# Patient Record
Sex: Female | Born: 1955 | Race: White | Hispanic: No | Smoking: Former smoker
Health system: Southern US, Community
[De-identification: ages and names within clinical notes are randomized; demographics above are authoritative.]

## PROBLEM LIST (undated history)

## (undated) DIAGNOSIS — R609 Edema, unspecified: Secondary | ICD-10-CM

## (undated) DIAGNOSIS — F329 Major depressive disorder, single episode, unspecified: Secondary | ICD-10-CM

## (undated) DIAGNOSIS — F32A Depression, unspecified: Secondary | ICD-10-CM

## (undated) DIAGNOSIS — K219 Gastro-esophageal reflux disease without esophagitis: Secondary | ICD-10-CM

## (undated) DIAGNOSIS — G4730 Sleep apnea, unspecified: Secondary | ICD-10-CM

## (undated) DIAGNOSIS — D649 Anemia, unspecified: Secondary | ICD-10-CM

## (undated) DIAGNOSIS — M1990 Unspecified osteoarthritis, unspecified site: Secondary | ICD-10-CM

## (undated) DIAGNOSIS — F419 Anxiety disorder, unspecified: Secondary | ICD-10-CM

## (undated) DIAGNOSIS — I10 Essential (primary) hypertension: Secondary | ICD-10-CM

## (undated) HISTORY — PX: FRACTURE SURGERY: SHX138

## (undated) HISTORY — PX: TUBAL LIGATION: SHX77

## (undated) HISTORY — PX: HERNIA REPAIR: SHX51

## (undated) HISTORY — PX: ABDOMINAL HYSTERECTOMY: SHX81

## (undated) HISTORY — PX: ROTATOR CUFF REPAIR: SHX139

## (undated) HISTORY — PX: TONSILLECTOMY: SUR1361

---

## 2006-02-12 ENCOUNTER — Emergency Department: Payer: Self-pay | Admitting: Unknown Physician Specialty

## 2006-04-03 ENCOUNTER — Ambulatory Visit: Payer: Self-pay

## 2006-04-16 ENCOUNTER — Ambulatory Visit: Payer: Self-pay | Admitting: Obstetrics and Gynecology

## 2006-04-25 ENCOUNTER — Ambulatory Visit: Payer: Self-pay | Admitting: Orthopaedic Surgery

## 2006-12-18 ENCOUNTER — Ambulatory Visit: Payer: Self-pay | Admitting: Obstetrics and Gynecology

## 2007-01-07 ENCOUNTER — Inpatient Hospital Stay: Payer: Self-pay | Admitting: Obstetrics and Gynecology

## 2007-01-17 ENCOUNTER — Ambulatory Visit: Payer: Self-pay | Admitting: Obstetrics and Gynecology

## 2007-02-02 ENCOUNTER — Inpatient Hospital Stay: Payer: Self-pay | Admitting: Obstetrics and Gynecology

## 2007-08-06 ENCOUNTER — Ambulatory Visit: Payer: Self-pay

## 2007-09-03 ENCOUNTER — Ambulatory Visit: Payer: Self-pay | Admitting: Orthopaedic Surgery

## 2008-02-24 ENCOUNTER — Ambulatory Visit: Payer: Self-pay | Admitting: Unknown Physician Specialty

## 2010-09-22 ENCOUNTER — Ambulatory Visit: Payer: Self-pay | Admitting: Unknown Physician Specialty

## 2010-09-30 ENCOUNTER — Ambulatory Visit: Payer: Self-pay | Admitting: Unknown Physician Specialty

## 2010-10-03 ENCOUNTER — Ambulatory Visit: Payer: Self-pay | Admitting: Unknown Physician Specialty

## 2010-10-18 ENCOUNTER — Ambulatory Visit: Payer: Self-pay | Admitting: Surgery

## 2010-10-21 ENCOUNTER — Ambulatory Visit: Payer: Self-pay | Admitting: Surgery

## 2010-11-17 ENCOUNTER — Ambulatory Visit: Payer: Self-pay | Admitting: Surgery

## 2010-12-01 ENCOUNTER — Ambulatory Visit: Payer: Self-pay | Admitting: Surgery

## 2012-12-12 ENCOUNTER — Encounter (HOSPITAL_COMMUNITY)
Admission: EM | Disposition: A | Discharge status: Home / Self Care | Payer: Self-pay | Source: Home / Self Care | Attending: Emergency Medicine

## 2012-12-12 ENCOUNTER — Emergency Department (HOSPITAL_COMMUNITY)
Admission: EM | Admit: 2012-12-12 | Discharge: 2012-12-12 | Disposition: A | Discharge status: Home / Self Care | Payer: 59 | Attending: Emergency Medicine | Admitting: Emergency Medicine

## 2012-12-12 ENCOUNTER — Emergency Department (HOSPITAL_COMMUNITY): Payer: 59

## 2012-12-12 ENCOUNTER — Encounter (HOSPITAL_COMMUNITY): Payer: Self-pay | Admitting: Emergency Medicine

## 2012-12-12 ENCOUNTER — Emergency Department (HOSPITAL_COMMUNITY): Payer: 59 | Admitting: Anesthesiology

## 2012-12-12 ENCOUNTER — Encounter (HOSPITAL_COMMUNITY): Payer: Self-pay | Admitting: Anesthesiology

## 2012-12-12 DIAGNOSIS — E669 Obesity, unspecified: Secondary | ICD-10-CM | POA: Insufficient documentation

## 2012-12-12 DIAGNOSIS — S62101A Fracture of unspecified carpal bone, right wrist, initial encounter for closed fracture: Secondary | ICD-10-CM

## 2012-12-12 DIAGNOSIS — S2239XA Fracture of one rib, unspecified side, initial encounter for closed fracture: Secondary | ICD-10-CM | POA: Insufficient documentation

## 2012-12-12 DIAGNOSIS — K219 Gastro-esophageal reflux disease without esophagitis: Secondary | ICD-10-CM | POA: Insufficient documentation

## 2012-12-12 DIAGNOSIS — S52509A Unspecified fracture of the lower end of unspecified radius, initial encounter for closed fracture: Secondary | ICD-10-CM | POA: Insufficient documentation

## 2012-12-12 DIAGNOSIS — Z6829 Body mass index (BMI) 29.0-29.9, adult: Secondary | ICD-10-CM | POA: Insufficient documentation

## 2012-12-12 DIAGNOSIS — W11XXXA Fall on and from ladder, initial encounter: Secondary | ICD-10-CM | POA: Insufficient documentation

## 2012-12-12 DIAGNOSIS — S2232XA Fracture of one rib, left side, initial encounter for closed fracture: Secondary | ICD-10-CM

## 2012-12-12 DIAGNOSIS — F411 Generalized anxiety disorder: Secondary | ICD-10-CM | POA: Insufficient documentation

## 2012-12-12 DIAGNOSIS — Z87891 Personal history of nicotine dependence: Secondary | ICD-10-CM | POA: Insufficient documentation

## 2012-12-12 DIAGNOSIS — F329 Major depressive disorder, single episode, unspecified: Secondary | ICD-10-CM | POA: Insufficient documentation

## 2012-12-12 DIAGNOSIS — F3289 Other specified depressive episodes: Secondary | ICD-10-CM | POA: Insufficient documentation

## 2012-12-12 HISTORY — PX: ORIF WRIST FRACTURE: SHX2133

## 2012-12-12 HISTORY — DX: Major depressive disorder, single episode, unspecified: F32.9

## 2012-12-12 HISTORY — DX: Anemia, unspecified: D64.9

## 2012-12-12 HISTORY — DX: Depression, unspecified: F32.A

## 2012-12-12 LAB — BASIC METABOLIC PANEL
BUN: 15 mg/dL (ref 6–23)
CO2: 24 mEq/L (ref 19–32)
Calcium: 8.7 mg/dL (ref 8.4–10.5)
Creatinine, Ser: 0.85 mg/dL (ref 0.50–1.10)
Glucose, Bld: 100 mg/dL — ABNORMAL HIGH (ref 70–99)
Sodium: 137 mEq/L (ref 135–145)

## 2012-12-12 LAB — CBC WITH DIFFERENTIAL/PLATELET
Eosinophils Relative: 0 % (ref 0–5)
HCT: 33.9 % — ABNORMAL LOW (ref 36.0–46.0)
Lymphocytes Relative: 8 % — ABNORMAL LOW (ref 12–46)
Lymphs Abs: 1.1 10*3/uL (ref 0.7–4.0)
MCV: 79.2 fL (ref 78.0–100.0)
Monocytes Absolute: 0.6 10*3/uL (ref 0.1–1.0)
Monocytes Relative: 4 % (ref 3–12)
RBC: 4.28 MIL/uL (ref 3.87–5.11)
WBC: 13.3 10*3/uL — ABNORMAL HIGH (ref 4.0–10.5)

## 2012-12-12 SURGERY — OPEN REDUCTION INTERNAL FIXATION (ORIF) WRIST FRACTURE
Anesthesia: General | Site: Arm Lower | Laterality: Right | Wound class: Clean

## 2012-12-12 MED ORDER — MEPERIDINE HCL 25 MG/ML IJ SOLN: 6.2500 mg | INTRAMUSCULAR | Status: DC | PRN

## 2012-12-12 MED ORDER — PROPOFOL 10 MG/ML IV BOLUS
INTRAVENOUS | Status: DC | PRN
  Administered 2012-12-12: 150 mg via INTRAVENOUS

## 2012-12-12 MED ORDER — MORPHINE SULFATE 4 MG/ML IJ SOLN
4.0000 mg | Freq: Once | INTRAMUSCULAR | Status: AC
  Administered 2012-12-12: 4 mg via INTRAVENOUS
  Filled 2012-12-12: qty 1

## 2012-12-12 MED ORDER — MIDAZOLAM HCL 5 MG/5ML IJ SOLN
INTRAMUSCULAR | Status: DC | PRN
  Administered 2012-12-12 (×2): 1 mg via INTRAVENOUS

## 2012-12-12 MED ORDER — IOHEXOL 300 MG/ML  SOLN
100.0000 mL | Freq: Once | INTRAMUSCULAR | Status: AC | PRN
  Administered 2012-12-12: 100 mL via INTRAVENOUS

## 2012-12-12 MED ORDER — SODIUM CHLORIDE 0.9 % IV SOLN
INTRAVENOUS | Status: DC | PRN
  Administered 2012-12-12: 19:00:00 via INTRAVENOUS

## 2012-12-12 MED ORDER — CEFAZOLIN SODIUM-DEXTROSE 2-3 GM-% IV SOLR
INTRAVENOUS | Status: DC | PRN
  Administered 2012-12-12: 2 g via INTRAVENOUS

## 2012-12-12 MED ORDER — FENTANYL CITRATE 0.05 MG/ML IJ SOLN
50.0000 ug | Freq: Once | INTRAMUSCULAR | Status: AC
  Administered 2012-12-12: 50 ug via INTRAVENOUS
  Filled 2012-12-12: qty 2

## 2012-12-12 MED ORDER — OXYCODONE HCL 5 MG PO TABS
ORAL_TABLET | ORAL | Status: AC
  Administered 2012-12-12: 5 mg via ORAL
  Filled 2012-12-12: qty 1

## 2012-12-12 MED ORDER — ONDANSETRON HCL 4 MG/2ML IJ SOLN
INTRAMUSCULAR | Status: DC | PRN
  Administered 2012-12-12: 4 mg via INTRAVENOUS

## 2012-12-12 MED ORDER — HYDROMORPHONE HCL PF 1 MG/ML IJ SOLN
0.2500 mg | INTRAMUSCULAR | Status: DC | PRN
  Administered 2012-12-12: 0.25 mg via INTRAVENOUS
  Administered 2012-12-12: 0.5 mg via INTRAVENOUS

## 2012-12-12 MED ORDER — KETOROLAC TROMETHAMINE 30 MG/ML IJ SOLN
INTRAMUSCULAR | Status: AC
  Administered 2012-12-12: 30 mg via INTRAVENOUS
  Filled 2012-12-12: qty 1

## 2012-12-12 MED ORDER — HYDROMORPHONE HCL PF 1 MG/ML IJ SOLN
INTRAMUSCULAR | Status: AC
  Administered 2012-12-12: 0.25 mg via INTRAVENOUS
  Filled 2012-12-12: qty 1

## 2012-12-12 MED ORDER — ACETAMINOPHEN 10 MG/ML IV SOLN
INTRAVENOUS | Status: DC | PRN
  Administered 2012-12-12: 1000 mg via INTRAVENOUS

## 2012-12-12 MED ORDER — 0.9 % SODIUM CHLORIDE (POUR BTL) OPTIME
TOPICAL | Status: DC | PRN
  Administered 2012-12-12: 1000 mL

## 2012-12-12 MED ORDER — SUFENTANIL CITRATE 50 MCG/ML IV SOLN
INTRAVENOUS | Status: DC | PRN
  Administered 2012-12-12 (×4): 5 ug via INTRAVENOUS

## 2012-12-12 MED ORDER — DEXAMETHASONE SODIUM PHOSPHATE 4 MG/ML IJ SOLN
INTRAMUSCULAR | Status: DC | PRN
  Administered 2012-12-12: 4 mg via INTRAVENOUS

## 2012-12-12 MED ORDER — LIDOCAINE HCL (CARDIAC) 20 MG/ML IV SOLN
INTRAVENOUS | Status: DC | PRN
  Administered 2012-12-12: 60 mg via INTRAVENOUS

## 2012-12-12 MED ORDER — OXYCODONE HCL 5 MG/5ML PO SOLN: 5.0000 mg | Freq: Once | ORAL | Status: AC | PRN

## 2012-12-12 MED ORDER — OXYCODONE HCL 5 MG PO TABS: 5.0000 mg | ORAL_TABLET | Freq: Once | ORAL | Status: AC | PRN

## 2012-12-12 MED ORDER — BUPIVACAINE-EPINEPHRINE PF 0.5-1:200000 % IJ SOLN
INTRAMUSCULAR | Status: DC | PRN
  Administered 2012-12-12: 30 mL

## 2012-12-12 MED ORDER — KETOROLAC TROMETHAMINE 30 MG/ML IJ SOLN: 30.0000 mg | Freq: Once | INTRAMUSCULAR | Status: AC

## 2012-12-12 MED ORDER — SCOPOLAMINE 1 MG/3DAYS TD PT72
MEDICATED_PATCH | TRANSDERMAL | Status: DC | PRN
  Administered 2012-12-12: 1 via TRANSDERMAL

## 2012-12-12 MED ORDER — MIDAZOLAM HCL 2 MG/2ML IJ SOLN: 0.5000 mg | Freq: Once | INTRAMUSCULAR | Status: DC | PRN

## 2012-12-12 MED ORDER — PROMETHAZINE HCL 25 MG/ML IJ SOLN: 6.2500 mg | INTRAMUSCULAR | Status: DC | PRN

## 2012-12-12 MED ORDER — LIDOCAINE-EPINEPHRINE (PF) 1.5 %-1:200000 IJ SOLN
INTRAMUSCULAR | Status: DC | PRN
  Administered 2012-12-12: 20 mL

## 2012-12-12 SURGICAL SUPPLY — 51 items
BANDAGE ELASTIC 3 VELCRO ST LF (GAUZE/BANDAGES/DRESSINGS) ×1 IMPLANT
BIT DRILL 1.9X15 (BIT) ×1
BIT DRILL 1.9X15MM (BIT) IMPLANT
BNDG CMPR 9X4 STRL LF SNTH (GAUZE/BANDAGES/DRESSINGS) ×1
BNDG ESMARK 4X9 LF (GAUZE/BANDAGES/DRESSINGS) ×1 IMPLANT
CANISTER SUCTION 1500CC (MISCELLANEOUS) ×2 IMPLANT
CHLORAPREP W/TINT 26ML (MISCELLANEOUS) ×2 IMPLANT
CLOTH BEACON ORANGE TIMEOUT ST (SAFETY) ×2 IMPLANT
CORDS BIPOLAR (ELECTRODE) ×2 IMPLANT
COVER SURGICAL LIGHT HANDLE (MISCELLANEOUS) ×2 IMPLANT
CUFF TOURNIQUET SINGLE 18IN (TOURNIQUET CUFF) ×2 IMPLANT
CUFF TOURNIQUET SINGLE 24IN (TOURNIQUET CUFF) IMPLANT
DRAIN TLS ROUND 10FR (DRAIN) IMPLANT
DRAPE OEC MINIVIEW 54X84 (DRAPES) ×2 IMPLANT
DRAPE SURG 17X23 STRL (DRAPES) ×2 IMPLANT
DRILL BIT 1.9X15MM (BIT) ×2
GAUZE XEROFORM 1X8 LF (GAUZE/BANDAGES/DRESSINGS) ×2 IMPLANT
GLOVE BIOGEL M STRL SZ7.5 (GLOVE) ×2 IMPLANT
GOWN PREVENTION PLUS XLARGE (GOWN DISPOSABLE) ×2 IMPLANT
GOWN STRL REIN XL XLG (GOWN DISPOSABLE) ×2 IMPLANT
KIT BASIN OR (CUSTOM PROCEDURE TRAY) ×2 IMPLANT
NEEDLE HYPO 22GX1.5 SAFETY (NEEDLE) IMPLANT
NS IRRIG 1000ML POUR BTL (IV SOLUTION) ×2 IMPLANT
PACK ORTHO EXTREMITY (CUSTOM PROCEDURE TRAY) ×2 IMPLANT
PAD CAST 3X4 CTTN HI CHSV (CAST SUPPLIES) IMPLANT
PAD CAST 4YDX4 CTTN HI CHSV (CAST SUPPLIES) IMPLANT
PADDING CAST ABS 4INX4YD NS (CAST SUPPLIES)
PADDING CAST ABS COTTON 4X4 ST (CAST SUPPLIES) ×1 IMPLANT
PADDING CAST COTTON 3X4 STRL (CAST SUPPLIES) ×2
PADDING CAST COTTON 4X4 STRL (CAST SUPPLIES)
PLATE ANATOMIC NARROW RIGHT (Plate) ×1 IMPLANT
SCREW BONE 2.7X12 (Screw) ×1 IMPLANT
SCREW LOCKING 2.7X12 (Screw) ×3 IMPLANT
SCREW LOCKING 2.7X16 (Screw) ×2 IMPLANT
SCREW LOCKING 2.7X18 (Screw) ×3 IMPLANT
SPLINT FIBERGLASS 3X35 (CAST SUPPLIES) ×1 IMPLANT
SPLINT PLASTER CAST XFAST 4X15 (CAST SUPPLIES) IMPLANT
SPLINT PLASTER XTRA FAST SET 4 (CAST SUPPLIES)
SPONGE GAUZE 4X4 12PLY (GAUZE/BANDAGES/DRESSINGS) ×1 IMPLANT
SUT ETHILON 3 0 PS 1 (SUTURE) ×1 IMPLANT
SUT ETHILON 4 0 PS 2 18 (SUTURE) ×1 IMPLANT
SUT VIC AB 3-0 PS1 18 (SUTURE) ×2
SUT VIC AB 3-0 PS1 18XBRD (SUTURE) ×1 IMPLANT
SUT VIC AB 4-0 PS2 27 (SUTURE) ×1 IMPLANT
SUT VICRYL 4-0 PS2 18IN ABS (SUTURE) ×1 IMPLANT
SYR BULB 3OZ (MISCELLANEOUS) ×2 IMPLANT
SYR CONTROL 10ML LL (SYRINGE) IMPLANT
TOWEL OR 17X24 6PK STRL BLUE (TOWEL DISPOSABLE) ×2 IMPLANT
TUBE CONNECTING 20X1/4 (TUBING) ×2 IMPLANT
UNDERPAD 30X30 INCONTINENT (UNDERPADS AND DIAPERS) ×2 IMPLANT
WATER STERILE IRR 1000ML POUR (IV SOLUTION) ×1 IMPLANT

## 2012-12-12 NOTE — ED Provider Notes (Signed)
Plan is for Hand Surgery to see patient in the ED I spoke to trauma dr Janee Morn since patient fell with rib fracture If no other complicating features, can be admitted to Hand service D/w dr Izora Ribas who will be seeing patient soon in the ED Pt advised of abnormal CT abd findings and need for colon cancer screening with PCP, she is aware of this and will obtain followup  Joya Gaskins, MD 12/12/12 1735

## 2012-12-12 NOTE — Anesthesia Postprocedure Evaluation (Signed)
  Anesthesia Post-op Note  Patient: Elizabeth Yu  Procedure(s) Performed: Procedure(s): OPEN REDUCTION INTERNAL FIXATION (ORIF) WRIST FRACTURE Right  (Right)  Patient Location: PACU  Anesthesia Type:GA combined with regional for post-op pain  Level of Consciousness: sedated, patient cooperative and responds to stimulation and voice  Airway and Oxygen Therapy: Patient Spontanous Breathing and Patient connected to nasal cannula oxygen  Post-op Pain: mild  Post-op Assessment: Post-op Vital signs reviewed, Patient's Cardiovascular Status Stable, Respiratory Function Stable, Patent Airway, No signs of Nausea or vomiting and Pain level controlled  Post-op Vital Signs: Reviewed and stable  Complications: No apparent anesthesia complications

## 2012-12-12 NOTE — Transfer of Care (Signed)
Immediate Anesthesia Transfer of Care Note  Patient: Elizabeth Yu  Procedure(s) Performed: Procedure(s): OPEN REDUCTION INTERNAL FIXATION (ORIF) WRIST FRACTURE Right  (Right)  Patient Location: PACU  Anesthesia Type:General and GA combined with regional for post-op pain  Level of Consciousness: oriented, sedated, patient cooperative and responds to stimulation  Airway & Oxygen Therapy: Patient Spontanous Breathing and Patient connected to nasal cannula oxygen  Post-op Assessment: Report given to PACU RN, Post -op Vital signs reviewed and stable and Patient moving all extremities X 4  Post vital signs: Reviewed and stable  Complications: No apparent anesthesia complications

## 2012-12-12 NOTE — Progress Notes (Signed)
Orthopedic Tech Progress Note Patient Details:  Elizabeth Yu 02/26/1956 [id number]  Ortho Devices Type of Ortho Device: Ace wrap;Sugartong splint Ortho Device/Splint Location: RUE Ortho Device/Splint Interventions: Ordered;Application   Jennye Moccasin 12/12/2012, 5:50 PM

## 2012-12-12 NOTE — H&P (Signed)
Reason for Consult:R wrist fx Referring Physician: ER  Elizabeth Yu is an 57 y.o. right handed female.  HPI: pt fell from ladder landing on r wrist, c/o pain, deformity; pain 7/10, no numbness, some soreness of L ribs, denies other injuries  Past Medical History  Diagnosis Date  . Depression   . Anemia     Past Surgical History  Procedure Laterality Date  . Tubal ligation    . Abdominal hysterectomy    . Rotator cuff repair    . Hernia repair      No family history on file.  Social History:  reports that she has never smoked. She has never used smokeless tobacco. She reports that  drinks alcohol. She reports that she does not use illicit drugs.  Allergies: No Known Allergies  Medications: I have reviewed the patient's current medications.  Results for orders placed during the hospital encounter of 12/12/12 (from the past 48 hour(s))  CBC WITH DIFFERENTIAL     Status: Abnormal   Collection Time    12/12/12  4:00 PM      Result Value Range   WBC 13.3 (*) 4.0 - 10.5 K/uL   RBC 4.28  3.87 - 5.11 MIL/uL   Hemoglobin 11.7 (*) 12.0 - 15.0 g/dL   HCT 16.1 (*) 09.6 - 04.5 %   MCV 79.2  78.0 - 100.0 fL   MCH 27.3  26.0 - 34.0 pg   MCHC 34.5  30.0 - 36.0 g/dL   RDW 40.9  81.1 - 91.4 %   Platelets 451 (*) 150 - 400 K/uL   Neutrophils Relative 87 (*) 43 - 77 %   Neutro Abs 11.6 (*) 1.7 - 7.7 K/uL   Lymphocytes Relative 8 (*) 12 - 46 %   Lymphs Abs 1.1  0.7 - 4.0 K/uL   Monocytes Relative 4  3 - 12 %   Monocytes Absolute 0.6  0.1 - 1.0 K/uL   Eosinophils Relative 0  0 - 5 %   Eosinophils Absolute 0.0  0.0 - 0.7 K/uL   Basophils Relative 0  0 - 1 %   Basophils Absolute 0.0  0.0 - 0.1 K/uL  BASIC METABOLIC PANEL     Status: Abnormal   Collection Time    12/12/12  4:00 PM      Result Value Range   Sodium 137  135 - 145 mEq/L   Potassium 3.7  3.5 - 5.1 mEq/L   Chloride 103  96 - 112 mEq/L   CO2 24  19 - 32 mEq/L   Glucose, Bld 100 (*) 70 - 99 mg/dL   BUN 15  6 - 23 mg/dL    Creatinine, Ser 7.82  0.50 - 1.10 mg/dL   Calcium 8.7  8.4 - 95.6 mg/dL   GFR calc non Af Amer 75 (*) >90 mL/min   GFR calc Af Amer 86 (*) >90 mL/min   Comment:            The eGFR has been calculated     using the CKD EPI equation.     This calculation has not been     validated in all clinical     situations.     eGFR's persistently     <90 mL/min signify     possible Chronic Kidney Disease.    Dg Chest 1 View  12/12/2012  *RADIOLOGY REPORT*  Clinical Data: Larey Seat from ladder, pain posterior left shoulder  CHEST - 1 VIEW  Comparison: None.  Findings:  No acute fracture is seen.  No pneumothorax is noted and there is no evidence of pleural effusion.  Mediastinal contours are unremarkable on this suboptimal inspiration film.  The heart is borderline enlarged.  No acute fracture is evident.  If suspicious of left lower rib fracture, left rib detail films would be recommended.  IMPRESSION: Suboptimal inspiration but no active lung disease.  No pneumothorax.   Original Report Authenticated By: Dwyane Dee, M.D.    Dg Scapula Left  12/12/2012  *RADIOLOGY REPORT*  Clinical Data: Larey Seat from ladder, left posterior shoulder pain  LEFT SCAPULA - 2+ VIEWS  Comparison: None.  Findings: The left shoulder appears to be in normal position within the left and interval joint space.  However, on the lateral view there is a subtle lucency overlying what appears to be the inferior glenoid of the left scapula.  No definite fractures seen on the frontal view.  If suspicious clinically, CT of this region would be recommended to exclude fracture. The left [facility] joint appears normally aligned in the views obtained.  IMPRESSION:  Cannot exclude subtle fracture of the scapula possibly involving the glenoid rim.  Consider CT of the left shoulder to assess further.   Original Report Authenticated By: Dwyane Dee, M.D.    Dg Wrist Complete Right  12/12/2012  *RADIOLOGY REPORT*  Clinical Data: Fall from ladder.  Wrist pain and  deformity.  RIGHT WRIST - COMPLETE 3+ VIEW  Comparison: None.  Findings: There is an impacted and dorsally angulated fracture of the distal radius.  There is probable extension of this fracture to the distal articular surface as well as the distal radioulnar joint.  There is a mildly displaced ulnar styloid fracture.  The carpal bones appear intact.  There is no dislocation.  IMPRESSION: Impacted intra-articular fracture of the distal radius with dorsal angulation.  Mildly displaced ulnar styloid fracture.   Original Report Authenticated By: Carey Bullocks, M.D.    Dg Hip Complete Left  12/12/2012  *RADIOLOGY REPORT*  Clinical Data: Larey Seat from ladder, pain in the left hip  LEFT HIP - COMPLETE 2+ VIEW  Comparison: None.  Findings: No acute fracture is seen.  Both hips are in normal position.  The pelvic rami appear intact.  The SI joints are unremarkable with some degenerative change noted on the right.  IMPRESSION: No acute fracture.   Original Report Authenticated By: Dwyane Dee, M.D.    Ct Head Wo Contrast  12/12/2012  *RADIOLOGY REPORT*  Clinical Data:  Fall from a ladder.  Pain.  CT HEAD WITHOUT CONTRAST CT CERVICAL SPINE WITHOUT CONTRAST  Technique:  Multidetector CT imaging of the head and cervical spine was performed following the standard protocol without intravenous contrast.  Multiplanar CT image reconstructions of the cervical spine were also generated.  Comparison:   None  CT HEAD  Findings: The brain appears normal without infarct, hemorrhage, mass lesion, mass effect, midline shift or abnormal extra-axial fluid collection.  No hydrocephalus or pneumocephalus.  The calvarium is intact.  Imaged paranasal sinuses and mastoid air cells are clear.  IMPRESSION: Negative exam.  CT CERVICAL SPINE  Findings: No fracture or subluxation of the cervical spine is identified.  Loss of disc space height and endplate spurring at C4- 5 and C5-6 are noted.  The patient has a fracture of the posterior arc of the  left first rib which is nondisplaced.  No other fracture is identified.  Lung apices are clear.  IMPRESSION:  1.  Negative for cervical spine fracture.  2.  Fracture posterior arc left first rib. 3.  Degenerative disc disease C4-5 and C5-6.   Original Report Authenticated By: Holley Dexter, M.D.    Ct Chest W Contrast  12/12/2012  *RADIOLOGY REPORT*  Clinical Data:  Status post fall.  Left shoulder pain.  CT CHEST, ABDOMEN AND PELVIS WITH CONTRAST  Technique:  Multidetector CT imaging of the chest, abdomen and pelvis was performed following the standard protocol during bolus administration of intravenous contrast.  Contrast: OMNIPAQUE IOHEXOL 300 MG/ML  SOLN  Comparison:   None.  CT CHEST  Findings:  There is no evidence of trauma to the heart or great vessels.  No pleural or pericardial effusion is seen.  Heart size is normal.  Small hiatal hernia is noted.  No pneumothorax is identified.  The lungs are clear.  Bones demonstrate a fracture of the posterior left first rib as seen on cervical spine CT scan this same date.  No other fracture is identified.  IMPRESSION:  1.  Fracture posterior arc left first rib.  No other acute finding. 2.  Small hiatal hernia.  CT ABDOMEN AND PELVIS  Findings:  Small low attenuating lesion in the left hepatic lobe is likely a cyst.  The liver otherwise appears normal.  The spleen, adrenal glands, pancreas, gallbladder and biliary tree are unremarkable.  Low attenuating lesions in the kidneys are most consistent with cysts.  There is scattered atherosclerosis in a nonaneurysmal abdominal aorta.  The patient is status post hysterectomy.  Sigmoid diverticulosis without diverticulitis is identified. There is focal thickening at the junction of the rectum and sigmoid colon which may be due to under distention.  There is some infiltration of surrounding pericolonic fat.  The colon is otherwise unremarkable.  Small bowel and appendix appear normal. There is no lymphadenopathy or  fluid.  No focal bony abnormalities identified.  IMPRESSION:  1.  No acute finding. 2. Focal wall thickening at the junction of the rectum and sigmoid colon may be due to under distention.  If the patient is not current with colon cancer screening, screening is recommended. 3.  Sigmoid diverticulosis without diverticulitis. 4.  Status post hysterectomy.   Original Report Authenticated By: Holley Dexter, M.D.    Ct Cervical Spine Wo Contrast  12/12/2012  *RADIOLOGY REPORT*  Clinical Data:  Fall from a ladder.  Pain.  CT HEAD WITHOUT CONTRAST CT CERVICAL SPINE WITHOUT CONTRAST  Technique:  Multidetector CT imaging of the head and cervical spine was performed following the standard protocol without intravenous contrast.  Multiplanar CT image reconstructions of the cervical spine were also generated.  Comparison:   None  CT HEAD  Findings: The brain appears normal without infarct, hemorrhage, mass lesion, mass effect, midline shift or abnormal extra-axial fluid collection.  No hydrocephalus or pneumocephalus.  The calvarium is intact.  Imaged paranasal sinuses and mastoid air cells are clear.  IMPRESSION: Negative exam.  CT CERVICAL SPINE  Findings: No fracture or subluxation of the cervical spine is identified.  Loss of disc space height and endplate spurring at C4- 5 and C5-6 are noted.  The patient has a fracture of the posterior arc of the left first rib which is nondisplaced.  No other fracture is identified.  Lung apices are clear.  IMPRESSION:  1.  Negative for cervical spine fracture. 2.  Fracture posterior arc left first rib. 3.  Degenerative disc disease C4-5 and C5-6.   Original Report Authenticated By: Holley Dexter, M.D.    Ct Abdomen Pelvis  W Contrast  12/12/2012  *RADIOLOGY REPORT*  Clinical Data:  Status post fall.  Left shoulder pain.  CT CHEST, ABDOMEN AND PELVIS WITH CONTRAST  Technique:  Multidetector CT imaging of the chest, abdomen and pelvis was performed following the standard  protocol during bolus administration of intravenous contrast.  Contrast: OMNIPAQUE IOHEXOL 300 MG/ML  SOLN  Comparison:   None.  CT CHEST  Findings:  There is no evidence of trauma to the heart or great vessels.  No pleural or pericardial effusion is seen.  Heart size is normal.  Small hiatal hernia is noted.  No pneumothorax is identified.  The lungs are clear.  Bones demonstrate a fracture of the posterior left first rib as seen on cervical spine CT scan this same date.  No other fracture is identified.  IMPRESSION:  1.  Fracture posterior arc left first rib.  No other acute finding. 2.  Small hiatal hernia.  CT ABDOMEN AND PELVIS  Findings:  Small low attenuating lesion in the left hepatic lobe is likely a cyst.  The liver otherwise appears normal.  The spleen, adrenal glands, pancreas, gallbladder and biliary tree are unremarkable.  Low attenuating lesions in the kidneys are most consistent with cysts.  There is scattered atherosclerosis in a nonaneurysmal abdominal aorta.  The patient is status post hysterectomy.  Sigmoid diverticulosis without diverticulitis is identified. There is focal thickening at the junction of the rectum and sigmoid colon which may be due to under distention.  There is some infiltration of surrounding pericolonic fat.  The colon is otherwise unremarkable.  Small bowel and appendix appear normal. There is no lymphadenopathy or fluid.  No focal bony abnormalities identified.  IMPRESSION:  1.  No acute finding. 2. Focal wall thickening at the junction of the rectum and sigmoid colon may be due to under distention.  If the patient is not current with colon cancer screening, screening is recommended. 3.  Sigmoid diverticulosis without diverticulitis. 4.  Status post hysterectomy.   Original Report Authenticated By: Holley Dexter, M.D.    Ct Shoulder Left Wo Contrast  12/12/2012  *RADIOLOGY REPORT*  Clinical Data: Left shoulder pain.  Fell.  CT OF THE LEFT SHOULDER WITHOUT  CONTRAST  Technique:  Multidetector CT imaging was performed according to the standard protocol. Multiplanar CT image reconstructions were also generated.  Comparison: Radiographs, same date.  Findings: There are remote surgical and remote post-traumatic changes involving the shoulder.  There is an old posterior glenoid rim fracture with partial osseous union.  There is a large bony fragment superior and medial to the humeral head which is most likely due to a prior avulsion fracture of the greater tuberosity with subsequent retraction by the rotator cuff tendons.  The infraspinatus and supraspinatus muscles appear atrophied which would be expected.  There is a single bone anchor in the humeral head neck junction region with multiple cystic changes in the humeral head possibly related to previous surgery.  There is a small calcification in the subacromial space which is likely tendinous.  The  No definite acute fracture involving the humeral head, glenoid or scapula.  The [facility] joint is intact.  IMPRESSION:  Remote post-traumatic and postsurgical changes with old ununited fractures but no definite acute injury.   Original Report Authenticated By: Rudie Meyer, M.D.    Dg Knee Complete 4 Views Right  12/12/2012  *RADIOLOGY REPORT*  Clinical Data: Larey Seat from ladder  RIGHT KNEE - COMPLETE 4+ VIEW  Comparison: None.  Findings: The right  knee joint spaces appear normal.  No fracture is seen.  No joint effusion is noted.  IMPRESSION: Negative.   Original Report Authenticated By: Dwyane Dee, M.D.     Pertinent items are noted in HPI. Temp:  [97.6 F (36.4 C)] 97.6 F (36.4 C) (04/17 1312) Pulse Rate:  [66-83] 83 (04/17 1710) Resp:  [18] 18 (04/17 1710) BP: (101-130)/(59-82) 101/69 mmHg (04/17 1710) SpO2:  [98 %-100 %] 99 % (04/17 1710) Weight:  [69.854 kg (154 lb)] 69.854 kg (154 lb) (04/17 1312) General appearance: alert and cooperative Resp: clear to auscultation bilaterally Cardio: regular rate and  rhythm GI: soft, non-tender; bowel sounds normal; no masses,  no organomegaly Extremities: extremities normal, atraumatic, no cyanosis or edema and edema R wrist with dorsal deformity, grossly n/v intact   Assessment/Plan: R distal radius, ulnar styloid fracture Plan: Will plan orif, risks explained to patient in detail.  Dessiree Sze CHRISTOPHER 12/12/2012, 5:42 PM

## 2012-12-12 NOTE — ED Notes (Signed)
Ice pack applied to right wrist. Patient verbalized understanding.

## 2012-12-12 NOTE — Anesthesia Preprocedure Evaluation (Addendum)
Anesthesia Evaluation  Patient identified by MRN, date of birth, ID band Patient awake    Reviewed: Allergy & Precautions, H&P , NPO status , Patient's Chart, lab work & pertinent test results  History of Anesthesia Complications (+) PONV  Airway Mallampati: I TM Distance: >3 FB Neck ROM: Full    Dental  (+) Teeth Intact, Caps and Dental Advisory Given   Pulmonary neg pulmonary ROS, former smoker,  breath sounds clear to auscultation  Pulmonary exam normal       Cardiovascular negative cardio ROS  Rhythm:Regular Rate:Normal     Neuro/Psych PSYCHIATRIC DISORDERS Anxiety Depression negative neurological ROS     GI/Hepatic Neg liver ROS, GERD- (treated with [employer])  Controlled,  Endo/Other  negative endocrine ROS  Renal/GU negative Renal ROS     Musculoskeletal   Abdominal (+) + obese,   Peds  Hematology   Anesthesia Other Findings   Reproductive/Obstetrics                          Anesthesia Physical Anesthesia Plan  ASA: II  Anesthesia Plan: General   Post-op Pain Management:    Induction: Intravenous  Airway Management Planned: LMA  Additional Equipment:   Intra-op Plan:   Post-operative Plan:   Informed Consent: I have reviewed the patients History and Physical, chart, labs and discussed the procedure including the risks, benefits and alternatives for the proposed anesthesia with the patient or authorized representative who has indicated his/her understanding and acceptance.   Dental advisory given  Plan Discussed with: CRNA and Surgeon  Anesthesia Plan Comments: (Plan routine monitors, GA- LMA OK, with axillary block for post op analgesia)        Anesthesia Quick Evaluation

## 2012-12-12 NOTE — ED Notes (Signed)
Patient cleaning windows and on the top of ladder when the ladder slipped and patient fell to ground.  Pain right wrist obvious deformity, left hip, right knee. EMS states patient alert answering and following commands appropriate.

## 2012-12-12 NOTE — ED Provider Notes (Signed)
History     CSN: [id number]  Arrival date & time 12/12/12  1252   First MD Initiated Contact with Patient 12/12/12 1304      Chief Complaint  Patient presents with  . Fall    (Consider location/radiation/quality/duration/timing/severity/associated sxs/prior treatment) Patient is a 57 y.o. female presenting with fall. The history is provided by the patient.  Fall The accident occurred 1 to 2 hours ago. The fall occurred from a ladder. Distance fallen: 6-7 feet. She landed on dirt. There was no blood loss. Point of impact: left side. Pain location: right wrist, right knee, left shoulder blade. The pain is at a severity of 8/10. The pain is severe. Associated symptoms include loss of consciousness (unknown if LOC or not). Pertinent negatives include no numbness and no headaches. Treatment on scene includes a c-collar and a backboard.    Past Medical History  Diagnosis Date  . Depression   . Anemia     Past Surgical History  Procedure Laterality Date  . Tubal ligation    . Abdominal hysterectomy    . Rotator cuff repair    . Hernia repair      No family history on file.  History  Substance Use Topics  . Smoking status: Never Smoker   . Smokeless tobacco: Never Used  . Alcohol Use: Yes    OB History   Grav Para Term Preterm Abortions TAB SAB Ect Mult Living                  Review of Systems  HENT: Negative for neck pain.   Musculoskeletal: Negative for back pain.  Neurological: Positive for loss of consciousness (unknown if LOC or not). Negative for weakness, numbness and headaches.  All other systems reviewed and are negative.    Allergies  Review of patient's allergies indicates no known allergies.  Home Medications   Current Outpatient Rx  Name  Route  Sig  Dispense  Refill  . citalopram (CELEXA) 20 MG tablet   Oral   Take 20 mg by mouth daily.         Marland Kitchen estradiol (ESTRACE) 1 MG tablet   Oral   Take 1 mg by mouth daily.         Marland Kitchen LORazepam  (ATIVAN) 0.5 MG tablet   Oral   Take 0.5 mg by mouth 2 (two) times daily.         . Phendimetrazine Tartrate 35 MG TABS   Oral   Take 35 mg by mouth 2 (two) times daily.           BP 130/59  Pulse 66  Temp(Src) 97.6 F (36.4 C) (Oral)  Resp 18  Ht 5\' 1"  (1.549 m)  Wt 154 lb (69.854 kg)  BMI 29.11 kg/m2  SpO2 100%  Physical Exam  Vitals reviewed. Constitutional: She is oriented to person, place, and time. She appears well-developed and well-nourished.  HENT:  Right Ear: External ear normal.  Left Ear: External ear normal.  Mouth/Throat: No oropharyngeal exudate.  Eyes: Conjunctivae and EOM are normal. Pupils are equal, round, and reactive to light.  Neck: Normal range of motion. Neck supple.  Cardiovascular: Normal rate, regular rhythm, normal heart sounds and intact distal pulses.  Exam reveals no gallop and no friction rub.   No murmur heard. Pulmonary/Chest: Effort normal and breath sounds normal.  Abdominal: Soft. Bowel sounds are normal. She exhibits no distension. There is no tenderness.  Musculoskeletal: She exhibits no edema.  Right wrist: She exhibits decreased range of motion, bony tenderness, swelling, crepitus and deformity.       Left hip: She exhibits bony tenderness. She exhibits normal range of motion, normal strength and no deformity.       Arms:      Legs: R wrist: cap refill <2s, able to wiggle all 5 digits, 2+ DP  Neurological: She is alert and oriented to person, place, and time. No cranial nerve deficit.  Skin: Skin is warm and dry. No rash noted.  Psychiatric: She has a normal mood and affect.    ED Course  Procedures (including critical care time)  Labs Reviewed - No data to display Dg Chest 1 View  12/12/2012  *RADIOLOGY REPORT*  Clinical Data: Larey Seat from ladder, pain posterior left shoulder  CHEST - 1 VIEW  Comparison: None.  Findings: No acute fracture is seen.  No pneumothorax is noted and there is no evidence of pleural effusion.   Mediastinal contours are unremarkable on this suboptimal inspiration film.  The heart is borderline enlarged.  No acute fracture is evident.  If suspicious of left lower rib fracture, left rib detail films would be recommended.  IMPRESSION: Suboptimal inspiration but no active lung disease.  No pneumothorax.   Original Report Authenticated By: Dwyane Dee, M.D.    Dg Scapula Left  12/12/2012  *RADIOLOGY REPORT*  Clinical Data: Larey Seat from ladder, left posterior shoulder pain  LEFT SCAPULA - 2+ VIEWS  Comparison: None.  Findings: The left shoulder appears to be in normal position within the left and interval joint space.  However, on the lateral view there is a subtle lucency overlying what appears to be the inferior glenoid of the left scapula.  No definite fractures seen on the frontal view.  If suspicious clinically, CT of this region would be recommended to exclude fracture. The left [facility] joint appears normally aligned in the views obtained.  IMPRESSION:  Cannot exclude subtle fracture of the scapula possibly involving the glenoid rim.  Consider CT of the left shoulder to assess further.   Original Report Authenticated By: Dwyane Dee, M.D.    Dg Wrist Complete Right  12/12/2012  *RADIOLOGY REPORT*  Clinical Data: Fall from ladder.  Wrist pain and deformity.  RIGHT WRIST - COMPLETE 3+ VIEW  Comparison: None.  Findings: There is an impacted and dorsally angulated fracture of the distal radius.  There is probable extension of this fracture to the distal articular surface as well as the distal radioulnar joint.  There is a mildly displaced ulnar styloid fracture.  The carpal bones appear intact.  There is no dislocation.  IMPRESSION: Impacted intra-articular fracture of the distal radius with dorsal angulation.  Mildly displaced ulnar styloid fracture.   Original Report Authenticated By: Carey Bullocks, M.D.    Dg Hip Complete Left  12/12/2012  *RADIOLOGY REPORT*  Clinical Data: Larey Seat from ladder, pain in the left  hip  LEFT HIP - COMPLETE 2+ VIEW  Comparison: None.  Findings: No acute fracture is seen.  Both hips are in normal position.  The pelvic rami appear intact.  The SI joints are unremarkable with some degenerative change noted on the right.  IMPRESSION: No acute fracture.   Original Report Authenticated By: Dwyane Dee, M.D.    Ct Head Wo Contrast  12/12/2012  *RADIOLOGY REPORT*  Clinical Data:  Fall from a ladder.  Pain.  CT HEAD WITHOUT CONTRAST CT CERVICAL SPINE WITHOUT CONTRAST  Technique:  Multidetector CT imaging of the head and  cervical spine was performed following the standard protocol without intravenous contrast.  Multiplanar CT image reconstructions of the cervical spine were also generated.  Comparison:   None  CT HEAD  Findings: The brain appears normal without infarct, hemorrhage, mass lesion, mass effect, midline shift or abnormal extra-axial fluid collection.  No hydrocephalus or pneumocephalus.  The calvarium is intact.  Imaged paranasal sinuses and mastoid air cells are clear.  IMPRESSION: Negative exam.  CT CERVICAL SPINE  Findings: No fracture or subluxation of the cervical spine is identified.  Loss of disc space height and endplate spurring at C4- 5 and C5-6 are noted.  The patient has a fracture of the posterior arc of the left first rib which is nondisplaced.  No other fracture is identified.  Lung apices are clear.  IMPRESSION:  1.  Negative for cervical spine fracture. 2.  Fracture posterior arc left first rib. 3.  Degenerative disc disease C4-5 and C5-6.   Original Report Authenticated By: Holley Dexter, M.D.    Ct Chest W Contrast  12/12/2012  *RADIOLOGY REPORT*  Clinical Data:  Status post fall.  Left shoulder pain.  CT CHEST, ABDOMEN AND PELVIS WITH CONTRAST  Technique:  Multidetector CT imaging of the chest, abdomen and pelvis was performed following the standard protocol during bolus administration of intravenous contrast.  Contrast: OMNIPAQUE IOHEXOL 300 MG/ML  SOLN   Comparison:   None.  CT CHEST  Findings:  There is no evidence of trauma to the heart or great vessels.  No pleural or pericardial effusion is seen.  Heart size is normal.  Small hiatal hernia is noted.  No pneumothorax is identified.  The lungs are clear.  Bones demonstrate a fracture of the posterior left first rib as seen on cervical spine CT scan this same date.  No other fracture is identified.  IMPRESSION:  1.  Fracture posterior arc left first rib.  No other acute finding. 2.  Small hiatal hernia.  CT ABDOMEN AND PELVIS  Findings:  Small low attenuating lesion in the left hepatic lobe is likely a cyst.  The liver otherwise appears normal.  The spleen, adrenal glands, pancreas, gallbladder and biliary tree are unremarkable.  Low attenuating lesions in the kidneys are most consistent with cysts.  There is scattered atherosclerosis in a nonaneurysmal abdominal aorta.  The patient is status post hysterectomy.  Sigmoid diverticulosis without diverticulitis is identified. There is focal thickening at the junction of the rectum and sigmoid colon which may be due to under distention.  There is some infiltration of surrounding pericolonic fat.  The colon is otherwise unremarkable.  Small bowel and appendix appear normal. There is no lymphadenopathy or fluid.  No focal bony abnormalities identified.  IMPRESSION:  1.  No acute finding. 2. Focal wall thickening at the junction of the rectum and sigmoid colon may be due to under distention.  If the patient is not current with colon cancer screening, screening is recommended. 3.  Sigmoid diverticulosis without diverticulitis. 4.  Status post hysterectomy.   Original Report Authenticated By: Holley Dexter, M.D.    Ct Cervical Spine Wo Contrast  12/12/2012  *RADIOLOGY REPORT*  Clinical Data:  Fall from a ladder.  Pain.  CT HEAD WITHOUT CONTRAST CT CERVICAL SPINE WITHOUT CONTRAST  Technique:  Multidetector CT imaging of the head and cervical spine was performed  following the standard protocol without intravenous contrast.  Multiplanar CT image reconstructions of the cervical spine were also generated.  Comparison:   None  CT  HEAD  Findings: The brain appears normal without infarct, hemorrhage, mass lesion, mass effect, midline shift or abnormal extra-axial fluid collection.  No hydrocephalus or pneumocephalus.  The calvarium is intact.  Imaged paranasal sinuses and mastoid air cells are clear.  IMPRESSION: Negative exam.  CT CERVICAL SPINE  Findings: No fracture or subluxation of the cervical spine is identified.  Loss of disc space height and endplate spurring at C4- 5 and C5-6 are noted.  The patient has a fracture of the posterior arc of the left first rib which is nondisplaced.  No other fracture is identified.  Lung apices are clear.  IMPRESSION:  1.  Negative for cervical spine fracture. 2.  Fracture posterior arc left first rib. 3.  Degenerative disc disease C4-5 and C5-6.   Original Report Authenticated By: Holley Dexter, M.D.    Ct Abdomen Pelvis W Contrast  12/12/2012  *RADIOLOGY REPORT*  Clinical Data:  Status post fall.  Left shoulder pain.  CT CHEST, ABDOMEN AND PELVIS WITH CONTRAST  Technique:  Multidetector CT imaging of the chest, abdomen and pelvis was performed following the standard protocol during bolus administration of intravenous contrast.  Contrast: OMNIPAQUE IOHEXOL 300 MG/ML  SOLN  Comparison:   None.  CT CHEST  Findings:  There is no evidence of trauma to the heart or great vessels.  No pleural or pericardial effusion is seen.  Heart size is normal.  Small hiatal hernia is noted.  No pneumothorax is identified.  The lungs are clear.  Bones demonstrate a fracture of the posterior left first rib as seen on cervical spine CT scan this same date.  No other fracture is identified.  IMPRESSION:  1.  Fracture posterior arc left first rib.  No other acute finding. 2.  Small hiatal hernia.  CT ABDOMEN AND PELVIS  Findings:  Small low  attenuating lesion in the left hepatic lobe is likely a cyst.  The liver otherwise appears normal.  The spleen, adrenal glands, pancreas, gallbladder and biliary tree are unremarkable.  Low attenuating lesions in the kidneys are most consistent with cysts.  There is scattered atherosclerosis in a nonaneurysmal abdominal aorta.  The patient is status post hysterectomy.  Sigmoid diverticulosis without diverticulitis is identified. There is focal thickening at the junction of the rectum and sigmoid colon which may be due to under distention.  There is some infiltration of surrounding pericolonic fat.  The colon is otherwise unremarkable.  Small bowel and appendix appear normal. There is no lymphadenopathy or fluid.  No focal bony abnormalities identified.  IMPRESSION:  1.  No acute finding. 2. Focal wall thickening at the junction of the rectum and sigmoid colon may be due to under distention.  If the patient is not current with colon cancer screening, screening is recommended. 3.  Sigmoid diverticulosis without diverticulitis. 4.  Status post hysterectomy.   Original Report Authenticated By: Holley Dexter, M.D.    Ct Shoulder Left Wo Contrast  12/12/2012  *RADIOLOGY REPORT*  Clinical Data: Left shoulder pain.  Fell.  CT OF THE LEFT SHOULDER WITHOUT CONTRAST  Technique:  Multidetector CT imaging was performed according to the standard protocol. Multiplanar CT image reconstructions were also generated.  Comparison: Radiographs, same date.  Findings: There are remote surgical and remote post-traumatic changes involving the shoulder.  There is an old posterior glenoid rim fracture with partial osseous union.  There is a large bony fragment superior and medial to the humeral head which is most likely due to a prior avulsion  fracture of the greater tuberosity with subsequent retraction by the rotator cuff tendons.  The infraspinatus and supraspinatus muscles appear atrophied which would be expected.  There is a single  bone anchor in the humeral head neck junction region with multiple cystic changes in the humeral head possibly related to previous surgery.  There is a small calcification in the subacromial space which is likely tendinous.  The  No definite acute fracture involving the humeral head, glenoid or scapula.  The [facility] joint is intact.  IMPRESSION:  Remote post-traumatic and postsurgical changes with old ununited fractures but no definite acute injury.   Original Report Authenticated By: Rudie Meyer, M.D.    Dg Knee Complete 4 Views Right  12/12/2012  *RADIOLOGY REPORT*  Clinical Data: Larey Seat from ladder  RIGHT KNEE - COMPLETE 4+ VIEW  Comparison: None.  Findings: The right knee joint spaces appear normal.  No fracture is seen.  No joint effusion is noted.  IMPRESSION: Negative.   Original Report Authenticated By: Dwyane Dee, M.D.      1. Right wrist fracture, closed, initial encounter   2. Rib fracture, left, closed, initial encounter   3. Fall from ladder, initial encounter       MDM    74 y F after 6-7 ft fall from a ladder.  Unknown if LOC.  Exam as above with obvious R wrist deformity and TTP of left hip/left scapula and R knee.  Right wrist NV intact, cap refill <2s, able to wiggle all 5 digits.   CT head/C spine and plain films.  Morphine for pain.   3:50 PM R wrist with dorsally angulated and translated radius fx.  Exam still intact with 2+ DP.  Given 1st rib fx seen on CXR, will proceed with full CT scanning.  Hand consult.  NPO.  Morphine for pain.  5:30 PM No other acute injuries noted on CT chest/abd/pelvis/shoulder.  Pt taken to OR with Hand Surgery.  She was informed of her abnormal abdominal CT findings by Dr. Bebe Shaggy.  Pt seen in conjunction with my attending, Dr. Manus Gunning.  Oleh Genin, MD PGY-II [facility] Emergency Medicine Resident     Oleh Genin, MD 12/12/12 [phone]

## 2012-12-12 NOTE — Anesthesia Procedure Notes (Signed)
Anesthesia Regional Block:  Axillary brachial plexus block  Pre-Anesthetic Checklist: ,, timeout performed, Correct Patient, Correct Site, Correct Laterality, Correct Procedure, Correct Position, site marked, Risks and benefits discussed,  Surgical consent,  Pre-op evaluation,  At surgeon's request and post-op pain management  Laterality: Right  Prep: chloraprep       Needles:  Injection technique: Single-shot  Needle Type: Other   (22ga Short "B" bevel)    Needle Gauge: 22 and 22 G    Additional Needles:  Procedures: paresthesia technique Axillary brachial plexus block  Nerve Stimulator or Paresthesia:  Response: transient median nerve paresthesia,  Response: transient radial nerve paresthesia,   Additional Responses:   Narrative:  Start time: 12/12/2012 7:22 PM End time: 12/12/2012 7:33 PM Injection made incrementally with aspirations every 5 mL.  Events: blood aspirated  Performed by: Personally  Anesthesiologist: Sandford Craze, MD  Additional Notes: Pt identified in Holding room.  Monitors applied. Working IV access confirmed. Sterile prep R axilla.  #22ga short "B" bevel to transient radial, then median paresthesiae.  Total 20cc 1.5% lidocaine with 1:200k epi and 30cc 0.5% Bupivacaine with 1:200k epi injected incrementally after negative test doses, distributed around each paresthesia.  Patient asymptomatic, VSS, heme aspirated but needle cleared and repositioned prior to injection, tolerated well.  Sandford Craze, MD  Axillary brachial plexus block

## 2012-12-12 NOTE — ED Notes (Signed)
Surgeon at bedside.  

## 2012-12-13 NOTE — ED Provider Notes (Signed)
I saw and evaluated the patient, reviewed the resident's note and I agree with the findings and plan.  Mechanical fall 6 feet from ladder.  No LOC.  ABCs intact. GCS 15.  R wrist deformity with intact radial pulse and cardinal hand movements.  No neck, chest or abdominal pain.  L scapular pain.  Glynn Octave, MD 12/13/12 1003

## 2012-12-13 NOTE — Op Note (Signed)
NAMEMARTHA, Elizabeth Yu                ACCOUNT NO.:  [id number]  MEDICAL RECORD NO.:  [id number]  LOCATION:  [facility]                         FACILITY:  [facility]  PHYSICIAN:  Johnette Abraham, MD    DATE OF BIRTH:  1956/05/29  DATE OF PROCEDURE:  12/12/2012 DATE OF DISCHARGE:  12/12/2012                              OPERATIVE REPORT   PREOPERATIVE DIAGNOSIS:  Closed fractures of the right distal radius and ulnar styloid.  POSTOPERATIVE DIAGNOSIS:  Closed fractures of the right distal radius and ulnar styloid.  PROCEDURE:  Open reduction and internal fixation of the right distal radius fracture with a Stryker volar plate.  ANESTHESIA:  General with the upper extremity block.  TOURNIQUET TIME:  Approximately 35 minutes.  COMPLICATIONS:  No acute complications.  ESTIMATED BLOOD LOSS:  Minimal blood loss.  INDICATIONS:  Mrs. Studer is a 57 year old female, fell off a ladder today with a deformity of her wrist, presented to the emergency department.  I was consulted.  On evaluation, she had a dorsally displaced distal radius fracture.  There was an articular multiple fragments.  Risks, benefits, and alternatives of surgical fixation with a volar plate were discussed with her including all risks.  She agreed with these risks and agreed to proceed with surgery.  Consent was obtained.  PROCEDURE IN DETAIL:  The patient was taken back to the operating room, placed supine on the operating room table.  After anesthesia was administered, upper extremity block.  Time-out was performed. Extremities were padded.  General anesthesia was administered.  The right upper extremity was prepped and draped in the normal sterile fashion.  The arm was exsanguinated, and the tourniquet was inflated to 250 mmHg.  A volar incision overlying the FCR tendon was then performed. The interval between the FCR tendon and the radial artery was created down the fracture site.  The pronator quadratus muscle was elevated  in an L-shaped fashion exposing the fracture site.  Fracture site was debrided and irrigated.  In-line traction and pressure were used to reduce the fracture.  An appropriate size Stryker volar plate was chosen and placed and temporary held in place with K-wires.  X-ray examination revealed good reduction and plate placement.  The oblong distal radius screw hole was then drilled, and under fluoroscopy the plate was adjusted to be centered and the length adequate.  This screw was then drilled measured and placed.  An additional 3 other radial shaft screws were placed for proximal fixation.  Again, x-ray revealed good anatomic alignment and plate placement.  Following each of the distal radius, screws were each drilled, measured, and appropriate size locking screws were placed.  Total of 5 screws were placed distally.  Final x-ray revealed near anatomic reduction with good plate and screw placement and near anatomic reduction of the ulnar styloid fracture as well.  Afterwards the wound was irrigated with irrigation solution.  The deep fascia was closed with several interrupted 3-0 Vicryl sutures.  The subcutaneous layers were closed with 3-0 Vicryl and the skin was run with a 4-0 Monocryl.  Sterile dressing and splint were applied.  The patient tolerated the procedure well.     Johnette Abraham,  MD     HCC/MEDQ  D:  12/12/2012  T:  12/13/2012  Job:  [id number]

## 2012-12-17 ENCOUNTER — Encounter (HOSPITAL_COMMUNITY): Payer: Self-pay | Admitting: General Surgery

## 2014-09-02 ENCOUNTER — Emergency Department: Payer: Self-pay | Admitting: Emergency Medicine

## 2014-09-02 LAB — BASIC METABOLIC PANEL
ANION GAP: 7 (ref 7–16)
BUN: 13 mg/dL (ref 7–18)
CHLORIDE: 103 mmol/L (ref 98–107)
CREATININE: 0.86 mg/dL (ref 0.60–1.30)
Calcium, Total: 8.8 mg/dL (ref 8.5–10.1)
Co2: 27 mmol/L (ref 21–32)
EGFR (African American): >60
EGFR (Non-African Amer.): >60
GLUCOSE: 74 mg/dL (ref 65–99)
Osmolality: 273 (ref 275–301)
POTASSIUM: 3.9 mmol/L (ref 3.5–5.1)
Sodium: 137 mmol/L (ref 136–145)

## 2014-09-02 LAB — CBC
HCT: 37.2 % (ref 35.0–47.0)
HGB: 12.3 g/dL (ref 12.0–16.0)
MCH: 27.5 pg (ref 26.0–34.0)
MCHC: 33 g/dL (ref 32.0–36.0)
MCV: 83 fL (ref 80–100)
PLATELETS: 318 10*3/uL (ref 150–440)
RBC: 4.48 10*6/uL (ref 3.80–5.20)
RDW: 14 % (ref 11.5–14.5)
WBC: 7.3 10*3/uL (ref 3.6–11.0)

## 2014-09-02 LAB — TROPONIN I
Troponin-I: <0.02 ng/mL
Troponin-I: <0.02 ng/mL

## 2014-09-02 LAB — HEPATIC FUNCTION PANEL A (ARMC)
ALBUMIN: 3.4 g/dL (ref 3.4–5.0)
ALK PHOS: 93 U/L
ALT: 19 U/L
BILIRUBIN TOTAL: 0.3 mg/dL (ref 0.2–1.0)
SGOT(AST): 16 U/L (ref 15–37)
Total Protein: 7.1 g/dL (ref 6.4–8.2)

## 2014-09-02 LAB — LIPASE, BLOOD: LIPASE: 133 U/L (ref 73–393)

## 2014-10-15 DIAGNOSIS — K219 Gastro-esophageal reflux disease without esophagitis: Secondary | ICD-10-CM | POA: Insufficient documentation

## 2014-10-26 DIAGNOSIS — I10 Essential (primary) hypertension: Secondary | ICD-10-CM | POA: Insufficient documentation

## 2014-11-05 ENCOUNTER — Ambulatory Visit: Payer: Self-pay | Admitting: Unknown Physician Specialty

## 2014-12-21 LAB — SURGICAL PATHOLOGY

## 2015-02-12 ENCOUNTER — Ambulatory Visit: Payer: 59 | Admitting: *Deleted

## 2015-02-12 ENCOUNTER — Encounter
Admission: RE | Disposition: A | Discharge status: Home / Self Care | Payer: Self-pay | Source: Ambulatory Visit | Attending: Unknown Physician Specialty

## 2015-02-12 ENCOUNTER — Ambulatory Visit
Admission: RE | Admit: 2015-02-12 | Discharge: 2015-02-12 | Disposition: A | Discharge status: Home / Self Care | Payer: 59 | Source: Ambulatory Visit | Attending: Unknown Physician Specialty | Admitting: Unknown Physician Specialty

## 2015-02-12 DIAGNOSIS — D649 Anemia, unspecified: Secondary | ICD-10-CM | POA: Diagnosis not present

## 2015-02-12 DIAGNOSIS — K2210 Ulcer of esophagus without bleeding: Secondary | ICD-10-CM | POA: Diagnosis present

## 2015-02-12 DIAGNOSIS — Z79899 Other long term (current) drug therapy: Secondary | ICD-10-CM | POA: Diagnosis not present

## 2015-02-12 DIAGNOSIS — K449 Diaphragmatic hernia without obstruction or gangrene: Secondary | ICD-10-CM | POA: Diagnosis not present

## 2015-02-12 DIAGNOSIS — E669 Obesity, unspecified: Secondary | ICD-10-CM | POA: Diagnosis not present

## 2015-02-12 DIAGNOSIS — F329 Major depressive disorder, single episode, unspecified: Secondary | ICD-10-CM | POA: Insufficient documentation

## 2015-02-12 HISTORY — PX: ESOPHAGOGASTRODUODENOSCOPY: SHX5428

## 2015-02-12 SURGERY — EGD (ESOPHAGOGASTRODUODENOSCOPY)
Anesthesia: General

## 2015-02-12 MED ORDER — PROPOFOL 10 MG/ML IV BOLUS
INTRAVENOUS | Status: DC | PRN
  Administered 2015-02-12: 100 mg via INTRAVENOUS

## 2015-02-12 MED ORDER — SODIUM CHLORIDE 0.9 % IV SOLN: INTRAVENOUS | Status: DC

## 2015-02-12 MED ORDER — PROPOFOL INFUSION 10 MG/ML OPTIME
INTRAVENOUS | Status: DC | PRN
  Administered 2015-02-12: 120 ug/kg/min via INTRAVENOUS

## 2015-02-12 MED ORDER — SODIUM CHLORIDE 0.9 % IV SOLN
INTRAVENOUS | Status: DC
  Administered 2015-02-12 (×2): via INTRAVENOUS

## 2015-02-12 NOTE — Transfer of Care (Signed)
Immediate Anesthesia Transfer of Care Note  Patient: Elizabeth Yu  Procedure(s) Performed: Procedure(s): ESOPHAGOGASTRODUODENOSCOPY (EGD) (N/A)  Patient Location: PACU  Anesthesia Type:General  Level of Consciousness: awake  Airway & Oxygen Therapy: Patient Spontanous Breathing  Post-op Assessment: Report given to RN  Post vital signs: stable  Last Vitals:  Filed Vitals:   02/12/15 0953  BP: 139/91  Pulse: 68  Temp: 36.8 C  Resp: 22    Complications: No apparent anesthesia complications

## 2015-02-12 NOTE — H&P (Signed)
Primary Care Physician:  Dortha Kern, MD Primary Gastroenterologist:  Dr. Mechele Collin  Pre-Procedure History & Physical: HPI:  Elizabeth Yu is a 59 y.o. female is here for an endoscopy.   Past Medical History  Diagnosis Date  . Depression   . Anemia     Past Surgical History  Procedure Laterality Date  . Tubal ligation    . Abdominal hysterectomy    . Rotator cuff repair    . Hernia repair    . Orif wrist fracture Right 12/12/2012    Procedure: OPEN REDUCTION INTERNAL FIXATION (ORIF) WRIST FRACTURE Right ;  Surgeon: Johnette Abraham, MD;  Location: [facility] OR;  Service: Plastics;  Laterality: Right;    Prior to Admission medications   Medication Sig Start Date End Date Taking? Authorizing Provider  citalopram (CELEXA) 20 MG tablet Take 20 mg by mouth daily.   Yes Historical Provider, MD  estradiol (ESTRACE) 1 MG tablet Take 1 mg by mouth daily.   Yes Historical Provider, MD  LORazepam (ATIVAN) 0.5 MG tablet Take 0.5 mg by mouth 2 (two) times daily.   Yes Historical Provider, MD  NON FORMULARY Take 8 oz by mouth daily. Herbal life shake   Yes Historical Provider, MD  NON FORMULARY Take 1 tablet by mouth 2 (two) times daily. Cell-u-loss   Yes Historical Provider, MD  NON FORMULARY Take 1 tablet by mouth daily. Thermo-bond fiber tablet   Yes Historical Provider, MD  omeprazole (PRILOSEC) 20 MG capsule Take 20 mg by mouth 2 (two) times daily.   Yes Historical Provider, MD    Allergies as of 12/29/2014  . (No Known Allergies)    No family history on file.  History   Social History  . Marital Status: Divorced    Spouse Name: N/A  . Number of Children: N/A  . Years of Education: N/A   Occupational History  . Not on file.   Social History Main Topics  . Smoking status: Never Smoker   . Smokeless tobacco: Never Used  . Alcohol Use: Yes  . Drug Use: No  . Sexual Activity: Not on file   Other Topics Concern  . Not on file   Social History Narrative  . No narrative on  file    Review of Systems: See HPI, otherwise negative ROS  Physical Exam: BP 139/91 mmHg  Pulse 68  Temp(Src) 98.2 F (36.8 C) (Tympanic)  Resp 22  Ht 5' (1.524 m)  Wt 83.462 kg (184 lb)  BMI 35.94 kg/m2  SpO2 98% General:   Alert,  pleasant and cooperative in NAD Head:  Normocephalic and atraumatic. Neck:  Supple; no masses or thyromegaly. Lungs:  Clear throughout to auscultation.    Heart:  Regular rate and rhythm. Abdomen:  Soft, nontender and nondistended. Normal bowel sounds, without guarding, and without rebound.   Neurologic:  Alert and  oriented x4;  grossly normal neurologically.  Impression/Plan: Elizabeth Yu is here for an endoscopy and colonoscopy to be performed for follow up large esophageal ulcers  Risks, benefits, limitations, and alternatives regarding  endoscopy have been reviewed with the patient.  Questions have been answered.  All parties agreeable.   Lynnae Prude, MD  02/12/2015, 11:09 AM   Primary Care Physician:  Dortha Kern, MD Primary Gastroenterologist:  Dr. Mechele Collin  Pre-Procedure History & Physical: HPI:  Elizabeth Yu is a 59 y.o. female is here for an endoscopy.   Past Medical History  Diagnosis Date  . Depression   .  Anemia     Past Surgical History  Procedure Laterality Date  . Tubal ligation    . Abdominal hysterectomy    . Rotator cuff repair    . Hernia repair    . Orif wrist fracture Right 12/12/2012    Procedure: OPEN REDUCTION INTERNAL FIXATION (ORIF) WRIST FRACTURE Right ;  Surgeon: Johnette Abraham, MD;  Location: [facility] OR;  Service: Plastics;  Laterality: Right;    Prior to Admission medications   Medication Sig Start Date End Date Taking? Authorizing Provider  citalopram (CELEXA) 20 MG tablet Take 20 mg by mouth daily.   Yes Historical Provider, MD  estradiol (ESTRACE) 1 MG tablet Take 1 mg by mouth daily.   Yes Historical Provider, MD  LORazepam (ATIVAN) 0.5 MG tablet Take 0.5 mg by mouth 2 (two) times daily.    Yes Historical Provider, MD  NON FORMULARY Take 8 oz by mouth daily. Herbal life shake   Yes Historical Provider, MD  NON FORMULARY Take 1 tablet by mouth 2 (two) times daily. Cell-u-loss   Yes Historical Provider, MD  NON FORMULARY Take 1 tablet by mouth daily. Thermo-bond fiber tablet   Yes Historical Provider, MD  omeprazole (PRILOSEC) 20 MG capsule Take 20 mg by mouth 2 (two) times daily.   Yes Historical Provider, MD    Allergies as of 12/29/2014  . (No Known Allergies)    No family history on file.  History   Social History  . Marital Status: Divorced    Spouse Name: N/A  . Number of Children: N/A  . Years of Education: N/A   Occupational History  . Not on file.   Social History Main Topics  . Smoking status: Never Smoker   . Smokeless tobacco: Never Used  . Alcohol Use: Yes  . Drug Use: No  . Sexual Activity: Not on file   Other Topics Concern  . Not on file   Social History Narrative  . No narrative on file    Review of Systems: See HPI, otherwise negative ROS  Physical Exam: BP 139/91 mmHg  Pulse 68  Temp(Src) 98.2 F (36.8 C) (Tympanic)  Resp 22  Ht 5' (1.524 m)  Wt 83.462 kg (184 lb)  BMI 35.94 kg/m2  SpO2 98% General:   Alert,  pleasant and cooperative in NAD Head:  Normocephalic and atraumatic. Neck:  Supple; no masses or thyromegaly. Lungs:  Clear throughout to auscultation.    Heart:  Regular rate and rhythm. Abdomen:  Soft, nontender and nondistended. Normal bowel sounds, without guarding, and without rebound.   Neurologic:  Alert and  oriented x4;  grossly normal neurologically.  Impression/Plan: Elizabeth Yu is here for an endoscopy to be performed for follow up large esophageal ulcers  Risks, benefits, limitations, and alternatives regarding  endoscopy have been reviewed with the patient.  Questions have been answered.  All parties agreeable.   Lynnae Prude, MD  02/12/2015, 11:09 AM

## 2015-02-12 NOTE — Anesthesia Postprocedure Evaluation (Signed)
  Anesthesia Post-op Note  Patient: Elizabeth Yu  Procedure(s) Performed: Procedure(s): ESOPHAGOGASTRODUODENOSCOPY (EGD) (N/A)  Anesthesia type:General  Patient location: PACU  Post pain: Pain level controlled  Post assessment: Post-op Vital signs reviewed, Patient's Cardiovascular Status Stable, Respiratory Function Stable, Patent Airway and No signs of Nausea or vomiting  Post vital signs: Reviewed and stable  Last Vitals:  Filed Vitals:   02/12/15 1145  BP: 127/87  Pulse: 61  Temp:   Resp: 16    Level of consciousness: awake, alert  and patient cooperative  Complications: No apparent anesthesia complications

## 2015-02-12 NOTE — Op Note (Signed)
[facility] Gastroenterology Patient Name: Elizabeth Yu Procedure Date: 02/12/2015 11:02 AM MRN: [id number] Account #: [id number] Date of Birth: March 06, 1956 Admit Type: Outpatient Age: 59 Room: [facility] ENDO ROOM 1 Gender: Female Note Status: Finalized Procedure:         Upper GI endoscopy Indications:       Follow-up of esophageal ulcer Providers:         Scot Jun, MD Referring MD:      Temple Pacini, MD (Referring MD) Medicines:         Propofol per Anesthesia Complications:     No immediate complications. Procedure:         Pre-Anesthesia Assessment:                    - After reviewing the risks and benefits, the patient was                     deemed in satisfactory condition to undergo the procedure.                    After obtaining informed consent, the endoscope was passed                     under direct vision. Throughout the procedure, the                     patient's blood pressure, pulse, and oxygen saturations                     were monitored continuously. The Olympus GIF-160 endoscope                     (S#. [id number]) was introduced through the mouth, and                     advanced to the second part of duodenum. The upper GI                     endoscopy was accomplished without difficulty. The patient                     tolerated the procedure well. Findings:      One superficial esophageal ulcer with no bleeding and no stigmata of       recent bleeding was found. The lesion was 2 mm in largest dimension. The       other ulcer was healed.      A small hiatus hernia was present. Biopsies were taken with a cold       forceps for histology. Done at [facility] to check for Barretts esophagus.      The stomach was normal.      The examined duodenum was normal. Impression:        - Non-bleeding esophageal ulcer.                    - Small hiatus hernia. Biopsied.                    - Normal stomach.                    - Normal examined  duodenum. Recommendation:    - Await pathology results. Scot Jun, MD 02/12/2015 11:28:20 AM This report has been signed electronically. Number of Addenda: 0 Note Initiated  On: 02/12/2015 11:02 AM      [facility]

## 2015-02-12 NOTE — Anesthesia Preprocedure Evaluation (Signed)
Anesthesia Evaluation  Patient identified by MRN, date of birth, ID band Patient awake    Reviewed: Allergy & Precautions, NPO status , Patient's Chart, lab work & pertinent test results  Airway Mallampati: II  TM Distance: >3 FB Neck ROM: Full    Dental  (+) Teeth Intact   Pulmonary  breath sounds clear to auscultation  Pulmonary exam normal       Cardiovascular Exercise Tolerance: Good Normal cardiovascular examRhythm:Regular Rate:Normal     Neuro/Psych    GI/Hepatic hiatal hernia, GERD-  Medicated,Had surgery for this in the past but problem may have recurred.   Endo/Other    Renal/GU      Musculoskeletal   Abdominal (+) + obese,  Abdomen: soft.    Peds  Hematology   Anesthesia Other Findings   Reproductive/Obstetrics                             Anesthesia Physical Anesthesia Plan  ASA: III  Anesthesia Plan: General   Post-op Pain Management:    Induction: Intravenous  Airway Management Planned: Nasal Cannula  Additional Equipment:   Intra-op Plan:   Post-operative Plan:   Informed Consent: I have reviewed the patients History and Physical, chart, labs and discussed the procedure including the risks, benefits and alternatives for the proposed anesthesia with the patient or authorized representative who has indicated his/her understanding and acceptance.     Plan Discussed with: CRNA  Anesthesia Plan Comments:         Anesthesia Quick Evaluation

## 2015-02-15 LAB — SURGICAL PATHOLOGY

## 2015-02-22 ENCOUNTER — Encounter: Payer: Self-pay | Admitting: Unknown Physician Specialty

## 2017-03-15 ENCOUNTER — Telehealth: Payer: Self-pay | Admitting: *Deleted

## 2017-03-15 NOTE — Telephone Encounter (Signed)
Received referral for initial lung cancer screening scan. Contacted patient and due to work schedule will attempt to arrange for apt that patient can attend in near future.

## 2017-04-10 ENCOUNTER — Telehealth: Payer: Self-pay | Admitting: *Deleted

## 2017-04-10 DIAGNOSIS — Z122 Encounter for screening for malignant neoplasm of respiratory organs: Secondary | ICD-10-CM

## 2017-04-10 DIAGNOSIS — Z87891 Personal history of nicotine dependence: Secondary | ICD-10-CM

## 2017-04-10 NOTE — Telephone Encounter (Signed)
Received referral for initial lung cancer screening scan. Contacted patient and obtained smoking history,(former, quit 07/28/2002, 30 pack year) as well as answering questions related to screening process. Patient denies signs of lung cancer such as weight loss or hemoptysis. Patient denies comorbidity that would prevent curative treatment if lung cancer were found. Patient is scheduled for shared decision making visit and CT scan on 04/25/17.

## 2017-04-25 ENCOUNTER — Inpatient Hospital Stay: Payer: 59 | Attending: Oncology | Admitting: Oncology

## 2017-04-25 ENCOUNTER — Ambulatory Visit
Admission: RE | Admit: 2017-04-25 | Discharge: 2017-04-25 | Disposition: A | Discharge status: Home / Self Care | Payer: 59 | Source: Ambulatory Visit | Attending: Oncology | Admitting: Oncology

## 2017-04-25 ENCOUNTER — Encounter: Payer: Self-pay | Admitting: Oncology

## 2017-04-25 DIAGNOSIS — Z87891 Personal history of nicotine dependence: Secondary | ICD-10-CM

## 2017-04-25 DIAGNOSIS — I2510 Atherosclerotic heart disease of native coronary artery without angina pectoris: Secondary | ICD-10-CM | POA: Insufficient documentation

## 2017-04-25 DIAGNOSIS — I700 Atherosclerosis of aorta: Secondary | ICD-10-CM | POA: Diagnosis not present

## 2017-04-25 DIAGNOSIS — Z122 Encounter for screening for malignant neoplasm of respiratory organs: Secondary | ICD-10-CM | POA: Diagnosis present

## 2017-04-25 DIAGNOSIS — K449 Diaphragmatic hernia without obstruction or gangrene: Secondary | ICD-10-CM | POA: Diagnosis not present

## 2017-04-26 NOTE — Progress Notes (Signed)
In accordance with CMS guidelines, patient has met eligibility criteria including age, absence of signs or symptoms of lung cancer.  Social History  Substance Use Topics  . Smoking status: Former Smoker    Packs/day: 1.00    Years: 30.00    Types: Cigarettes    Quit date: 07/2002  . Smokeless tobacco: Never Used  . Alcohol use Yes     A shared decision-making session was conducted prior to the performance of CT scan. This includes one or more decision aids, includes benefits and harms of screening, follow-up diagnostic testing, over-diagnosis, false positive rate, and total radiation exposure.  Counseling on the importance of adherence to annual lung cancer LDCT screening, impact of co-morbidities, and ability or willingness to undergo diagnosis and treatment is imperative for compliance of the program.  Counseling on the importance of continued smoking cessation for former smokers; the importance of smoking cessation for current smokers, and information about tobacco cessation interventions have been given to patient including [place] and [place] quit [place] programs.  Written order for lung cancer screening with LDCT has been given to the patient and any and all questions have been answered to the best of my abilities.   Yearly follow up will be coordinated by Burgess Estelle, Thoracic Navigator.  Faythe Casa, NP 04/26/2017 10:28 AM

## 2017-04-27 ENCOUNTER — Telehealth: Payer: Self-pay | Admitting: *Deleted

## 2017-04-27 NOTE — Telephone Encounter (Signed)
Notified patient of LDCT lung cancer screening program results with recommendation for 12 month follow up imaging. Notified that any follow up will be arranged with PCP as patient is 15 years since smoking cessation. Also notified of incidental findings noted below and is encouraged to discuss further with PCP who will receive a copy of this note and/or the CT report. Patient verbalizes understanding.   IMPRESSION: 1. Lung-RADS 2-S, benign appearance or behavior. Continue annual screening with low-dose chest CT without contrast in 12 months. 2. The "S" modifier above refers to potentially clinically significant non lung cancer related findings. Specifically, two-vessel coronary atherosclerosis. 3. Small hiatal hernia.  Aortic Atherosclerosis (ICD10-I70.0).

## 2017-06-20 ENCOUNTER — Emergency Department
Admission: EM | Admit: 2017-06-20 | Discharge: 2017-06-20 | Disposition: A | Discharge status: Home / Self Care | Payer: 59 | Attending: Emergency Medicine | Admitting: Emergency Medicine

## 2017-06-20 ENCOUNTER — Emergency Department: Payer: 59

## 2017-06-20 ENCOUNTER — Encounter: Payer: Self-pay | Admitting: Emergency Medicine

## 2017-06-20 DIAGNOSIS — S63502A Unspecified sprain of left wrist, initial encounter: Secondary | ICD-10-CM | POA: Insufficient documentation

## 2017-06-20 DIAGNOSIS — Z79899 Other long term (current) drug therapy: Secondary | ICD-10-CM | POA: Diagnosis not present

## 2017-06-20 DIAGNOSIS — Y999 Unspecified external cause status: Secondary | ICD-10-CM | POA: Diagnosis not present

## 2017-06-20 DIAGNOSIS — S6992XA Unspecified injury of left wrist, hand and finger(s), initial encounter: Secondary | ICD-10-CM | POA: Diagnosis present

## 2017-06-20 DIAGNOSIS — Y929 Unspecified place or not applicable: Secondary | ICD-10-CM | POA: Diagnosis not present

## 2017-06-20 DIAGNOSIS — Z87891 Personal history of nicotine dependence: Secondary | ICD-10-CM | POA: Insufficient documentation

## 2017-06-20 DIAGNOSIS — Y9301 Activity, walking, marching and hiking: Secondary | ICD-10-CM | POA: Insufficient documentation

## 2017-06-20 DIAGNOSIS — W109XXA Fall (on) (from) unspecified stairs and steps, initial encounter: Secondary | ICD-10-CM | POA: Diagnosis not present

## 2017-06-20 MED ORDER — OXYCODONE-ACETAMINOPHEN 5-325 MG PO TABS
1.0000 | ORAL_TABLET | Freq: Once | ORAL | Status: AC
  Administered 2017-06-20: 1 via ORAL

## 2017-06-20 MED ORDER — OXYCODONE-ACETAMINOPHEN 5-325 MG PO TABS: 1.0000 | ORAL_TABLET | Freq: Four times a day (QID) | ORAL | 0 refills | Status: DC | PRN

## 2017-06-20 MED ORDER — IBUPROFEN 600 MG PO TABS: 600.0000 mg | ORAL_TABLET | Freq: Three times a day (TID) | ORAL | 0 refills | Status: DC | PRN

## 2017-06-20 MED ORDER — OXYCODONE-ACETAMINOPHEN 5-325 MG PO TABS
ORAL_TABLET | ORAL | Status: AC
  Filled 2017-06-20: qty 1

## 2017-06-20 NOTE — ED Notes (Addendum)
Pt given wheelchair upon arrival. Holding left arm. States she fell this AM. No LOC. Pt alert and oriented X4, active, cooperative, pt in NAD. RR even and unlabored, color WNL.  Reports being dizzy after fall, none at this time. Family with patient.

## 2017-06-20 NOTE — ED Provider Notes (Signed)
[facility] Emergency Department Provider Note   ____________________________________________   First MD Initiated Contact with Patient 06/20/17 1109     (approximate)  I have reviewed the triage vital signs and the nursing notes.   HISTORY  Chief Complaint Wrist Pain    HPI Elizabeth Yu is a 61 y.o. female patient complaining of left wrist pain secondary to a trip and fall on steps prior to arrival. Eyes LOC or head injury. Patient stated pain increases with flexion extension of the wrist. Patient rates pain as a 10 over 10. Patient described a pain as "achy". No palliative measures prior to arrival.Patient is right-hand dominant. Patient denies loss of sensation.   Past Medical History:  Diagnosis Date  . Anemia   . Depression     There are no active problems to display for this patient.   Past Surgical History:  Procedure Laterality Date  . ABDOMINAL HYSTERECTOMY    . ESOPHAGOGASTRODUODENOSCOPY N/A 02/12/2015   Procedure: ESOPHAGOGASTRODUODENOSCOPY (EGD);  Surgeon: Scot Junobert T Elliott, MD;  Location: [facility] ENDOSCOPY;  Service: Endoscopy;  Laterality: N/A;  . HERNIA REPAIR    . ORIF WRIST FRACTURE Right 12/12/2012   Procedure: OPEN REDUCTION INTERNAL FIXATION (ORIF) WRIST FRACTURE Right ;  Surgeon: Johnette AbrahamHarrill C Coley, MD;  Location: [facility] OR;  Service: Plastics;  Laterality: Right;  . ROTATOR CUFF REPAIR    . TUBAL LIGATION      Prior to Admission medications   Medication Sig Start Date End Date Taking? Authorizing Provider  citalopram (CELEXA) 20 MG tablet Take 20 mg by mouth daily.    [provider]  estradiol (ESTRACE) 1 MG tablet Take 1 mg by mouth daily.    [provider]  ibuprofen (ADVIL,MOTRIN) 600 MG tablet Take 1 tablet (600 mg total) by mouth every 8 (eight) hours as needed. 06/20/17   Joni ReiningSmith, Ronald K, PA-C  LORazepam (ATIVAN) 0.5 MG tablet Take 0.5 mg by mouth 2 (two) times daily.    [provider]  NON  FORMULARY Take 8 oz by mouth daily. Herbal life shake    [provider]  NON FORMULARY Take 1 tablet by mouth 2 (two) times daily. Cell-u-loss    [provider]  NON FORMULARY Take 1 tablet by mouth daily. Thermo-bond fiber tablet    [provider]  omeprazole (PRILOSEC) 20 MG capsule Take 20 mg by mouth 2 (two) times daily.    [provider]  oxyCODONE-acetaminophen (ROXICET) 5-325 MG tablet Take 1 tablet by mouth every 6 (six) hours as needed. 06/20/17 06/20/18  Joni ReiningSmith, Ronald K, PA-C    Allergies Patient has no known allergies.  History reviewed. No pertinent family history.  Social History Social History  Substance Use Topics  . Smoking status: Former Smoker    Packs/day: 1.00    Years: 30.00    Types: Cigarettes    Quit date: 07/2002  . Smokeless tobacco: Never Used  . Alcohol use Yes     Comment: occasional    Review of Systems  Constitutional: No fever/chills Eyes: No visual changes. ENT: No sore throat. Cardiovascular: Denies chest pain. Respiratory: Denies shortness of breath. Gastrointestinal: No abdominal pain.  No nausea, no vomiting.  No diarrhea.  No constipation. Genitourinary: Negative for dysuria. Musculoskeletal: Negative for back pain. Skin: Negative for rash. Neurological: Negative for headaches, focal weakness or numbness. Psychiatric:Depression Hematological/Lymphatic:Anemia   ____________________________________________   PHYSICAL EXAM:  VITAL SIGNS: ED Triage Vitals  Enc Vitals Group  BP 06/20/17 0936 (!) 173/90     Pulse Rate 06/20/17 0936 74     Resp 06/20/17 0936 18     Temp 06/20/17 0936 98.7 F (37.1 C)     Temp Source 06/20/17 0936 Oral     SpO2 06/20/17 0936 98 %     Weight --      Height --      Head Circumference --      Peak Flow --      Pain Score 06/20/17 0944 10     Pain Loc --      Pain Edu? --      Excl. in GC? --     Constitutional: Alert and oriented. Well appearing  and in no acute distress.  non-erythematous. Neck: No stridor.  Hematological/Lymphatic/Immunilogical: No cervical lymphadenopathy. Cardiovascular: Normal rate, regular rhythm. Grossly normal heart sounds.  Good peripheral circulation. Elevated blood pressure Respiratory: Normal respiratory effort.  No retractions. Lungs CTAB. Gastrointestinal: Soft and nontender. No distention. No abdominal bruits. No CVA tenderness. Musculoskeletal: No lower extremity tenderness nor edema.  No joint effusions. Neurologic:  Normal speech and language. No gross focal neurologic deficits are appreciated. No gait instability. Skin:  Skin is warm, dry and intact. No rash noted. Psychiatric: Mood and affect are normal. Speech and behavior are normal.  ____________________________________________   LABS (all labs ordered are listed, but only abnormal results are displayed)  Labs Reviewed - No data to display ____________________________________________  EKG   ____________________________________________  RADIOLOGY  Dg Wrist Complete Left  Result Date: 06/20/2017 CLINICAL DATA:  Fall with wrist pain. EXAM: LEFT WRIST - COMPLETE 3+ VIEW COMPARISON:  None. FINDINGS: Three view study shows somewhat unusual cortical contour along the lateral aspect of the distal radial metaphysis, but no distal radius fracture is evident. The pronator quadratus fat pad is normal in appearance on the lateral film. Loss of joint space noted in the radiocarpal joint. Degenerative changes characterize the first carpometacarpal joint. IMPRESSION: Degenerative changes in the radiocarpal and first carpometacarpal joints. No acute bony findings. Electronically Signed   By: Kennith Center M.D.   On: 06/20/2017 10:13    ____________________________________________   PROCEDURES  Procedure(s) performed: None  Procedures  Critical Care performed: No  ____________________________________________   INITIAL IMPRESSION / ASSESSMENT  AND PLAN / ED COURSE  As part of my medical decision making, I reviewed the following data within the electronic MEDICAL RECORD NUMBER    Left wrist pain and edema secondary to sprain. Discussed x-ray findings with patient. Patient placed in a volar OCL splint. Patient advised to wear splint for 3-5 days and follow-up PCP if condition persists.      ____________________________________________   FINAL CLINICAL IMPRESSION(S) / ED DIAGNOSES  Final diagnoses:  Sprain of left wrist, initial encounter      NEW MEDICATIONS STARTED DURING THIS VISIT:  New Prescriptions   IBUPROFEN (ADVIL,MOTRIN) 600 MG TABLET    Take 1 tablet (600 mg total) by mouth every 8 (eight) hours as needed.   OXYCODONE-ACETAMINOPHEN (ROXICET) 5-325 MG TABLET    Take 1 tablet by mouth every 6 (six) hours as needed.     Note:  This document was prepared using Dragon voice recognition software and may include unintentional dictation errors.    Joni Reining, PA-C 06/20/17 1119    Emily Filbert, MD 06/20/17 [phone]

## 2017-06-20 NOTE — ED Triage Notes (Signed)
Pt from home after tripping on steps. Left wrist is swollen. Pt denies loc, head injury. Pt alert & oriented with NAD noted.

## 2017-06-20 NOTE — ED Notes (Signed)
Pt c/o left wrist pain after falling down her stairs at home this morning. Pt is unable to move her left hand and has a 10/10 pain.

## 2017-10-15 ENCOUNTER — Encounter: Payer: Self-pay | Admitting: *Deleted

## 2017-10-18 ENCOUNTER — Ambulatory Visit: Payer: Commercial Managed Care - HMO | Admitting: Anesthesiology

## 2017-10-18 ENCOUNTER — Encounter: Payer: Self-pay | Admitting: *Deleted

## 2017-10-18 ENCOUNTER — Encounter
Admission: RE | Disposition: A | Discharge status: Home / Self Care | Payer: Self-pay | Source: Ambulatory Visit | Attending: Ophthalmology

## 2017-10-18 ENCOUNTER — Ambulatory Visit
Admission: RE | Admit: 2017-10-18 | Discharge: 2017-10-18 | Disposition: A | Discharge status: Home / Self Care | Payer: Commercial Managed Care - HMO | Source: Ambulatory Visit | Attending: Ophthalmology | Admitting: Ophthalmology

## 2017-10-18 ENCOUNTER — Other Ambulatory Visit: Payer: Self-pay

## 2017-10-18 DIAGNOSIS — Z87891 Personal history of nicotine dependence: Secondary | ICD-10-CM | POA: Diagnosis not present

## 2017-10-18 DIAGNOSIS — H2512 Age-related nuclear cataract, left eye: Secondary | ICD-10-CM | POA: Diagnosis present

## 2017-10-18 DIAGNOSIS — I10 Essential (primary) hypertension: Secondary | ICD-10-CM | POA: Diagnosis not present

## 2017-10-18 DIAGNOSIS — K219 Gastro-esophageal reflux disease without esophagitis: Secondary | ICD-10-CM | POA: Insufficient documentation

## 2017-10-18 DIAGNOSIS — F419 Anxiety disorder, unspecified: Secondary | ICD-10-CM | POA: Insufficient documentation

## 2017-10-18 DIAGNOSIS — F329 Major depressive disorder, single episode, unspecified: Secondary | ICD-10-CM | POA: Diagnosis not present

## 2017-10-18 DIAGNOSIS — G4730 Sleep apnea, unspecified: Secondary | ICD-10-CM | POA: Diagnosis not present

## 2017-10-18 DIAGNOSIS — Z79899 Other long term (current) drug therapy: Secondary | ICD-10-CM | POA: Diagnosis not present

## 2017-10-18 HISTORY — DX: Sleep apnea, unspecified: G47.30

## 2017-10-18 HISTORY — DX: Essential (primary) hypertension: I10

## 2017-10-18 HISTORY — PX: CATARACT EXTRACTION W/PHACO: SHX586

## 2017-10-18 HISTORY — DX: Edema, unspecified: R60.9

## 2017-10-18 HISTORY — DX: Unspecified osteoarthritis, unspecified site: M19.90

## 2017-10-18 HISTORY — DX: Anxiety disorder, unspecified: F41.9

## 2017-10-18 HISTORY — DX: Gastro-esophageal reflux disease without esophagitis: K21.9

## 2017-10-18 SURGERY — PHACOEMULSIFICATION, CATARACT, WITH IOL INSERTION
Anesthesia: Monitor Anesthesia Care | Site: Eye | Laterality: Left | Wound class: Clean

## 2017-10-18 MED ORDER — MOXIFLOXACIN HCL 0.5 % OP SOLN
OPHTHALMIC | Status: DC | PRN
  Administered 2017-10-18: 0.2 mL via OPHTHALMIC

## 2017-10-18 MED ORDER — POVIDONE-IODINE 5 % OP SOLN
OPHTHALMIC | Status: DC | PRN
  Administered 2017-10-18: 1 via OPHTHALMIC

## 2017-10-18 MED ORDER — MOXIFLOXACIN HCL 0.5 % OP SOLN
OPHTHALMIC | Status: AC
  Filled 2017-10-18: qty 3

## 2017-10-18 MED ORDER — FENTANYL CITRATE (PF) 100 MCG/2ML IJ SOLN
INTRAMUSCULAR | Status: DC | PRN
  Administered 2017-10-18: 50 ug via INTRAVENOUS

## 2017-10-18 MED ORDER — LIDOCAINE HCL (PF) 4 % IJ SOLN
INTRAOCULAR | Status: DC | PRN
  Administered 2017-10-18: 4 mL via OPHTHALMIC

## 2017-10-18 MED ORDER — SODIUM HYALURONATE 23 MG/ML IO SOLN
INTRAOCULAR | Status: DC | PRN
  Administered 2017-10-18: 0.6 mL via INTRAOCULAR

## 2017-10-18 MED ORDER — SODIUM CHLORIDE 0.9 % IV SOLN
INTRAVENOUS | Status: DC
  Administered 2017-10-18: 07:00:00 via INTRAVENOUS

## 2017-10-18 MED ORDER — MIDAZOLAM HCL 2 MG/2ML IJ SOLN
INTRAMUSCULAR | Status: AC
  Filled 2017-10-18: qty 2

## 2017-10-18 MED ORDER — LIDOCAINE HCL (PF) 4 % IJ SOLN
INTRAMUSCULAR | Status: AC
  Filled 2017-10-18: qty 5

## 2017-10-18 MED ORDER — SODIUM HYALURONATE 23 MG/ML IO SOLN
INTRAOCULAR | Status: AC
  Filled 2017-10-18: qty 0.6

## 2017-10-18 MED ORDER — ARMC OPHTHALMIC DILATING DROPS
1.0000 "application " | OPHTHALMIC | Status: AC
  Administered 2017-10-18 (×3): 1 via OPHTHALMIC

## 2017-10-18 MED ORDER — MIDAZOLAM HCL 2 MG/2ML IJ SOLN
INTRAMUSCULAR | Status: DC | PRN
  Administered 2017-10-18: 2 mg via INTRAVENOUS

## 2017-10-18 MED ORDER — POVIDONE-IODINE 5 % OP SOLN
OPHTHALMIC | Status: AC
  Filled 2017-10-18: qty 30

## 2017-10-18 MED ORDER — EPINEPHRINE PF 1 MG/ML IJ SOLN
INTRAMUSCULAR | Status: AC
  Filled 2017-10-18: qty 1

## 2017-10-18 MED ORDER — SODIUM HYALURONATE 10 MG/ML IO SOLN
INTRAOCULAR | Status: DC | PRN
  Administered 2017-10-18: 0.55 mL via INTRAOCULAR

## 2017-10-18 MED ORDER — MOXIFLOXACIN HCL 0.5 % OP SOLN: 1.0000 [drp] | OPHTHALMIC | Status: DC | PRN

## 2017-10-18 MED ORDER — FENTANYL CITRATE (PF) 100 MCG/2ML IJ SOLN
INTRAMUSCULAR | Status: AC
Start: 2017-10-18 — End: 2017-10-18
  Filled 2017-10-18: qty 2

## 2017-10-18 MED ORDER — BSS IO SOLN
INTRAOCULAR | Status: DC | PRN
  Administered 2017-10-18: 200 mL via INTRAOCULAR

## 2017-10-18 SURGICAL SUPPLY — 16 items
DISSECTOR HYDRO NUCLEUS 50X22 (MISCELLANEOUS) ×3 IMPLANT
GLOVE BIO SURGEON STRL SZ8 (GLOVE) ×3 IMPLANT
GLOVE BIOGEL M 6.5 STRL (GLOVE) ×3 IMPLANT
GLOVE SURG LX 7.5 STRW (GLOVE) ×2
GLOVE SURG LX STRL 7.5 STRW (GLOVE) ×1 IMPLANT
GOWN STRL REUS W/ TWL LRG LVL3 (GOWN DISPOSABLE) ×2 IMPLANT
GOWN STRL REUS W/TWL LRG LVL3 (GOWN DISPOSABLE) ×6
LABEL CATARACT MEDS ST (LABEL) ×3 IMPLANT
LENS IOL TECNIS ITEC 19.0 (Intraocular Lens) ×2 IMPLANT
PACK CATARACT (MISCELLANEOUS) ×3 IMPLANT
PACK CATARACT KING (MISCELLANEOUS) ×3 IMPLANT
PACK EYE AFTER SURG (MISCELLANEOUS) ×3 IMPLANT
SOL BSS BAG (MISCELLANEOUS) ×3
SOLUTION BSS BAG (MISCELLANEOUS) ×1 IMPLANT
WATER STERILE IRR 250ML POUR (IV SOLUTION) ×3 IMPLANT
WIPE NON LINTING 3.25X3.25 (MISCELLANEOUS) ×3 IMPLANT

## 2017-10-18 NOTE — Anesthesia Postprocedure Evaluation (Signed)
Anesthesia Post Note  Patient: Elizabeth Yu  Procedure(s) Performed: CATARACT EXTRACTION PHACO AND INTRAOCULAR LENS PLACEMENT (IOC) (Left Eye)  Patient location during evaluation: PACU Anesthesia Type: MAC Level of consciousness: awake and alert Pain management: pain level controlled Vital Signs Assessment: post-procedure vital signs reviewed and stable Respiratory status: spontaneous breathing, nonlabored ventilation, respiratory function stable and patient connected to nasal cannula oxygen Cardiovascular status: stable and blood pressure returned to baseline Postop Assessment: no apparent nausea or vomiting Anesthetic complications: no     Last Vitals:  Vitals:   10/18/17 0924 10/18/17 0929  BP: 135/79 129/82  Pulse: 81   Resp: 18   Temp: 36.6 C   SpO2: 99%     Last Pain:  Vitals:   10/18/17 0705  TempSrc: Temporal                 Tanaka Gillen S

## 2017-10-18 NOTE — Anesthesia Preprocedure Evaluation (Signed)
Anesthesia Evaluation  Patient identified by MRN, date of birth, ID band Patient awake    Reviewed: Allergy & Precautions, NPO status , Patient's Chart, lab work & pertinent test results, reviewed documented beta blocker date and time   Airway Mallampati: III  TM Distance: >3 FB     Dental  (+) Chipped   Pulmonary sleep apnea and Continuous Positive Airway Pressure Ventilation , former smoker,           Cardiovascular hypertension, Pt. on medications      Neuro/Psych PSYCHIATRIC DISORDERS Anxiety Depression    GI/Hepatic GERD  Controlled,  Endo/Other    Renal/GU      Musculoskeletal  (+) Arthritis ,   Abdominal   Peds  Hematology  (+) anemia ,   Anesthesia Other Findings Obese.  Reproductive/Obstetrics                             Anesthesia Physical Anesthesia Plan  ASA: III  Anesthesia Plan: MAC   Post-op Pain Management:    Induction:   PONV Risk Score and Plan:   Airway Management Planned:   Additional Equipment:   Intra-op Plan:   Post-operative Plan:   Informed Consent: I have reviewed the patients History and Physical, chart, labs and discussed the procedure including the risks, benefits and alternatives for the proposed anesthesia with the patient or authorized representative who has indicated his/her understanding and acceptance.     Plan Discussed with: CRNA  Anesthesia Plan Comments:         Anesthesia Quick Evaluation

## 2017-10-18 NOTE — Op Note (Signed)
OPERATIVE NOTE  Elizabeth Yu [id number] 10/18/2017   PREOPERATIVE DIAGNOSIS:  Nuclear sclerotic cataract left eye.  H25.12   POSTOPERATIVE DIAGNOSIS:    Nuclear sclerotic cataract left eye.     PROCEDURE:  Phacoemusification with posterior chamber intraocular lens placement of the left eye   LENS:   Implant Name Type Inv. Item Serial No. Manufacturer Lot No. LRB No. Used  LENS IOL DIOP 19.0 - [id number] Intraocular Lens LENS IOL DIOP 19.0 [id number] AMO  Left 1       PCB00 +19.0   ULTRASOUND TIME: 0 minutes 19 seconds.  CDE 1.38   SURGEON:  Willey BladeBradley [place] Sloop, MD, MPH   ANESTHESIA:  Topical with tetracaine drops augmented with 1% preservative-free intracameral lidocaine.  ESTIMATED BLOOD LOSS: <1 mL   COMPLICATIONS:  None.   DESCRIPTION OF PROCEDURE:  The patient was identified in the holding room and transported to the operating room and placed in the supine position under the operating microscope.  The left eye was identified as the operative eye and it was prepped and draped in the usual sterile ophthalmic fashion.   A 1.0 millimeter clear-corneal paracentesis was made at the 5:00 position. 0.5 ml of preservative-free 1% lidocaine with epinephrine was injected into the anterior chamber.  The anterior chamber was filled with Healon 5 viscoelastic.  A 2.4 millimeter keratome was used to make a near-clear corneal incision at the 2:00 position.  A curvilinear capsulorrhexis was made with a cystotome and capsulorrhexis forceps.  Balanced salt solution was used to hydrodissect and hydrodelineate the nucleus.   Phacoemulsification was then used in stop and chop fashion to remove the lens nucleus and epinucleus.  The remaining cortex was then removed using the irrigation and aspiration handpiece. Healon was then placed into the capsular bag to distend it for lens placement.  The first preloaded lens failed to advance when the plunger was pushed forward, so this lens was discarded and the  backup lens was utilized.  (same model, PCB00 +19.0) as intended.  The lens was then injected into the capsular bag.  The remaining viscoelastic was aspirated.   Wounds were hydrated with balanced salt solution.  The anterior chamber was inflated to a physiologic pressure with balanced salt solution.  Intracameral vigamox 0.1 mL undiltued was injected into the eye and a drop placed onto the ocular surface.  No wound leaks were noted.  The patient was taken to the recovery room in stable condition without complications of anesthesia or surgery  Willey BladeBradley Hakop Humbarger 10/18/2017, 9:20 AM

## 2017-10-18 NOTE — Discharge Instructions (Signed)
Eye Surgery Discharge Instructions  Expect mild scratchy sensation or mild soreness. DO NOT RUB YOUR EYE!  The day of surgery:  Minimal physical activity, but bed rest is not required  No reading, computer work, or close hand work  No bending, lifting, or straining.  May watch TV  For 24 hours:  No driving, legal decisions, or alcoholic beverages  Safety precautions  Eat anything you prefer: It is better to start with liquids, then soup then solid foods.  _____ Eye patch should be worn until postoperative exam tomorrow.  ____ Solar shield eyeglasses should be worn for comfort in the sunlight/patch while sleeping  Resume all regular medications including aspirin or Coumadin if these were discontinued prior to surgery. You may shower, bathe, shave, or wash your hair. Tylenol may be taken for mild discomfort.  Call your doctor if you experience significant pain, nausea, or vomiting, fever > 101 or other signs of infection. [phone] or [phone] Specific instructions:  Follow-up Information    [place] CraneKing, Bradley Mark, MD Follow up.   Specialty:  Ophthalmology Why:  10/19/17 @ 9:30am [place] Contact information: [address] [address] [place] [address] [phone]

## 2017-10-18 NOTE — H&P (Signed)
The History and Physical notes are on paper, have been signed, and are to be scanned.   I have examined the patient and there are no changes to the H&P.   Willey BladeBradley King 10/18/2017 8:38 AM

## 2017-10-18 NOTE — Anesthesia Post-op Follow-up Note (Signed)
Anesthesia QCDR form completed.        

## 2017-10-18 NOTE — Transfer of Care (Signed)
Immediate Anesthesia Transfer of Care Note  Patient: Elizabeth Yu  Procedure(s) Performed: CATARACT EXTRACTION PHACO AND INTRAOCULAR LENS PLACEMENT (IOC) (Left Eye)  Patient Location: PACU  Anesthesia Type:MAC  Level of Consciousness: awake  Airway & Oxygen Therapy: Patient Spontanous Breathing and Patient connected to nasal cannula oxygen  Post-op Assessment: Report given to RN and Post -op Vital signs reviewed and stable  Post vital signs: Reviewed and stable  Last Vitals:  Vitals:   10/18/17 0705  BP: (!) 144/76  Pulse: 94  Resp: 18  Temp: (!) 36.2 C  SpO2: 99%    Last Pain:  Vitals:   10/18/17 0705  TempSrc: Temporal         Complications: No apparent anesthesia complications

## 2017-10-18 NOTE — OR Nursing (Signed)
Discharge instructions discussed with pt and daughter. Both voice understanding. 

## 2017-11-13 ENCOUNTER — Encounter: Payer: Self-pay | Admitting: *Deleted

## 2017-11-15 ENCOUNTER — Encounter
Admission: RE | Disposition: A | Discharge status: Home / Self Care | Payer: Self-pay | Source: Ambulatory Visit | Attending: Ophthalmology

## 2017-11-15 ENCOUNTER — Ambulatory Visit: Payer: Commercial Managed Care - HMO | Admitting: Anesthesiology

## 2017-11-15 ENCOUNTER — Ambulatory Visit
Admission: RE | Admit: 2017-11-15 | Discharge: 2017-11-15 | Disposition: A | Discharge status: Home / Self Care | Payer: Commercial Managed Care - HMO | Source: Ambulatory Visit | Attending: Ophthalmology | Admitting: Ophthalmology

## 2017-11-15 ENCOUNTER — Encounter: Payer: Self-pay | Admitting: Emergency Medicine

## 2017-11-15 DIAGNOSIS — G4730 Sleep apnea, unspecified: Secondary | ICD-10-CM | POA: Insufficient documentation

## 2017-11-15 DIAGNOSIS — Z87891 Personal history of nicotine dependence: Secondary | ICD-10-CM | POA: Insufficient documentation

## 2017-11-15 DIAGNOSIS — F418 Other specified anxiety disorders: Secondary | ICD-10-CM | POA: Diagnosis not present

## 2017-11-15 DIAGNOSIS — J449 Chronic obstructive pulmonary disease, unspecified: Secondary | ICD-10-CM | POA: Insufficient documentation

## 2017-11-15 DIAGNOSIS — H2511 Age-related nuclear cataract, right eye: Secondary | ICD-10-CM | POA: Diagnosis present

## 2017-11-15 DIAGNOSIS — Z79899 Other long term (current) drug therapy: Secondary | ICD-10-CM | POA: Diagnosis not present

## 2017-11-15 DIAGNOSIS — I10 Essential (primary) hypertension: Secondary | ICD-10-CM | POA: Diagnosis not present

## 2017-11-15 DIAGNOSIS — K219 Gastro-esophageal reflux disease without esophagitis: Secondary | ICD-10-CM | POA: Insufficient documentation

## 2017-11-15 HISTORY — PX: CATARACT EXTRACTION W/PHACO: SHX586

## 2017-11-15 SURGERY — PHACOEMULSIFICATION, CATARACT, WITH IOL INSERTION
Anesthesia: Monitor Anesthesia Care | Site: Eye | Laterality: Right | Wound class: Clean

## 2017-11-15 MED ORDER — SODIUM HYALURONATE 23 MG/ML IO SOLN
INTRAOCULAR | Status: DC | PRN
  Administered 2017-11-15: 0.6 mL via INTRAOCULAR

## 2017-11-15 MED ORDER — ARMC OPHTHALMIC DILATING DROPS
1.0000 "application " | OPHTHALMIC | Status: AC
  Administered 2017-11-15 (×3): 1 via OPHTHALMIC

## 2017-11-15 MED ORDER — MOXIFLOXACIN HCL 0.5 % OP SOLN
OPHTHALMIC | Status: AC
  Filled 2017-11-15: qty 3

## 2017-11-15 MED ORDER — FENTANYL CITRATE (PF) 100 MCG/2ML IJ SOLN
INTRAMUSCULAR | Status: AC
  Filled 2017-11-15: qty 2

## 2017-11-15 MED ORDER — MIDAZOLAM HCL 2 MG/2ML IJ SOLN
INTRAMUSCULAR | Status: DC | PRN
  Administered 2017-11-15 (×2): 1 mg via INTRAVENOUS

## 2017-11-15 MED ORDER — MOXIFLOXACIN HCL 0.5 % OP SOLN: 1.0000 [drp] | OPHTHALMIC | Status: DC | PRN

## 2017-11-15 MED ORDER — SODIUM CHLORIDE 0.9 % IV SOLN
INTRAVENOUS | Status: DC
  Administered 2017-11-15: 08:00:00 via INTRAVENOUS

## 2017-11-15 MED ORDER — FENTANYL CITRATE (PF) 100 MCG/2ML IJ SOLN
INTRAMUSCULAR | Status: DC | PRN
  Administered 2017-11-15: 50 ug via INTRAVENOUS

## 2017-11-15 MED ORDER — LIDOCAINE HCL (PF) 4 % IJ SOLN
INTRAOCULAR | Status: DC | PRN
  Administered 2017-11-15: 4 mL via OPHTHALMIC

## 2017-11-15 MED ORDER — ARMC OPHTHALMIC DILATING DROPS
OPHTHALMIC | Status: AC
  Administered 2017-11-15: 1 via OPHTHALMIC
  Filled 2017-11-15: qty 0.4

## 2017-11-15 MED ORDER — POVIDONE-IODINE 5 % OP SOLN
OPHTHALMIC | Status: AC
  Filled 2017-11-15: qty 30

## 2017-11-15 MED ORDER — SODIUM HYALURONATE 10 MG/ML IO SOLN
INTRAOCULAR | Status: DC | PRN
  Administered 2017-11-15: 0.55 mL via INTRAOCULAR

## 2017-11-15 MED ORDER — LIDOCAINE HCL (PF) 4 % IJ SOLN
INTRAMUSCULAR | Status: AC
  Filled 2017-11-15: qty 5

## 2017-11-15 MED ORDER — SODIUM HYALURONATE 23 MG/ML IO SOLN
INTRAOCULAR | Status: AC
  Filled 2017-11-15: qty 0.6

## 2017-11-15 MED ORDER — POVIDONE-IODINE 5 % OP SOLN
OPHTHALMIC | Status: DC | PRN
  Administered 2017-11-15: 1 via OPHTHALMIC

## 2017-11-15 MED ORDER — BSS IO SOLN
INTRAOCULAR | Status: DC | PRN
  Administered 2017-11-15: 1 via INTRAOCULAR

## 2017-11-15 MED ORDER — MOXIFLOXACIN HCL 0.5 % OP SOLN
OPHTHALMIC | Status: DC | PRN
  Administered 2017-11-15: 0.2 mL via OPHTHALMIC

## 2017-11-15 MED ORDER — MIDAZOLAM HCL 2 MG/2ML IJ SOLN
INTRAMUSCULAR | Status: AC
  Filled 2017-11-15: qty 2

## 2017-11-15 MED ORDER — EPINEPHRINE PF 1 MG/ML IJ SOLN
INTRAMUSCULAR | Status: AC
  Filled 2017-11-15: qty 1

## 2017-11-15 SURGICAL SUPPLY — 16 items
DISSECTOR HYDRO NUCLEUS 50X22 (MISCELLANEOUS) ×3 IMPLANT
GLOVE BIO SURGEON STRL SZ8 (GLOVE) ×3 IMPLANT
GLOVE BIOGEL M 6.5 STRL (GLOVE) ×3 IMPLANT
GLOVE SURG LX 7.5 STRW (GLOVE) ×2
GLOVE SURG LX STRL 7.5 STRW (GLOVE) ×1 IMPLANT
GOWN STRL REUS W/ TWL LRG LVL3 (GOWN DISPOSABLE) ×2 IMPLANT
GOWN STRL REUS W/TWL LRG LVL3 (GOWN DISPOSABLE) ×6
LABEL CATARACT MEDS ST (LABEL) ×3 IMPLANT
LENS IOL TECNIS ITEC 18.5 (Intraocular Lens) ×2 IMPLANT
PACK CATARACT (MISCELLANEOUS) ×3 IMPLANT
PACK CATARACT KING (MISCELLANEOUS) ×3 IMPLANT
PACK EYE AFTER SURG (MISCELLANEOUS) ×3 IMPLANT
SOL BSS BAG (MISCELLANEOUS) ×3
SOLUTION BSS BAG (MISCELLANEOUS) ×1 IMPLANT
WATER STERILE IRR 250ML POUR (IV SOLUTION) ×3 IMPLANT
WIPE NON LINTING 3.25X3.25 (MISCELLANEOUS) ×3 IMPLANT

## 2017-11-15 NOTE — Discharge Instructions (Signed)
Eye Surgery Discharge Instructions  Expect mild scratchy sensation or mild soreness. DO NOT RUB YOUR EYE!  The day of surgery:  Minimal physical activity, but bed rest is not required  No reading, computer work, or close hand work  No bending, lifting, or straining.  May watch TV  For 24 hours:  No driving, legal decisions, or alcoholic beverages  Safety precautions  Eat anything you prefer: It is better to start with liquids, then soup then solid foods.  _____ Eye patch should be worn until postoperative exam tomorrow.  ____ Solar shield eyeglasses should be worn for comfort in the sunlight/patch while sleeping  Resume all regular medications including aspirin or Coumadin if these were discontinued prior to surgery. You may shower, bathe, shave, or wash your hair. Tylenol may be taken for mild discomfort.  Call your doctor if you experience significant pain, nausea, or vomiting, fever > 101 or other signs of infection. [phone] or [phone] Specific instructions:  Follow-up Information    [place] CraneKing, Bradley Mark, MD Follow up.   Specialty:  Ophthalmology Why:  March 22 at 10:25am Contact information: [address] [address] [place] [address] [phone]

## 2017-11-15 NOTE — Op Note (Signed)
OPERATIVE NOTE  Elizabeth Yu [id number] 11/15/2017   PREOPERATIVE DIAGNOSIS:  Nuclear sclerotic cataract right eye.  H25.11   POSTOPERATIVE DIAGNOSIS:    Nuclear sclerotic cataract right eye.     PROCEDURE:  Phacoemusification with posterior chamber intraocular lens placement of the right eye   LENS:   Implant Name Type Inv. Item Serial No. Manufacturer Lot No. LRB No. Used  LENS IOL DIOP 18.5 - [id number] Intraocular Lens LENS IOL DIOP 18.5 [id number]  Right 1       PCB00 +18.5   ULTRASOUND TIME: 0 minutes 09 seconds.  CDE 0.69   SURGEON:  Willey BladeBradley King, MD, MPH  ANESTHESIOLOGIST: Anesthesiologist: Naomie DeanKephart, William K, MD   ANESTHESIA:  Topical with tetracaine drops augmented with 1% preservative-free intracameral lidocaine.  ESTIMATED BLOOD LOSS: less than 1 mL.   COMPLICATIONS:  None.   DESCRIPTION OF PROCEDURE:  The patient was identified in the holding room and transported to the operating room and placed in the supine position under the operating microscope.  The right eye was identified as the operative eye and it was prepped and draped in the usual sterile ophthalmic fashion.   A 1.0 millimeter clear-corneal paracentesis was made at the 10:30 position. 0.5 ml of preservative-free 1% lidocaine with epinephrine was injected into the anterior chamber.  The anterior chamber was filled with Healon 5 viscoelastic.  A 2.4 millimeter keratome was used to make a near-clear corneal incision at the 8:00 position.  A curvilinear capsulorrhexis was made with a cystotome and capsulorrhexis forceps.  Balanced salt solution was used to hydrodissect and hydrodelineate the nucleus.   Phacoemulsification was then used in stop and chop fashion to remove the lens nucleus and epinucleus.  The remaining cortex was then removed using the irrigation and aspiration handpiece. Healon was then placed into the capsular bag to distend it for lens placement.  A lens was then injected into the  capsular bag.  The remaining viscoelastic was aspirated.   Wounds were hydrated with balanced salt solution.  The anterior chamber was inflated to a physiologic pressure with balanced salt solution.   Intracameral vigamox 0.1 mL undiluted was injected into the eye and a drop placed onto the ocular surface.  No wound leaks were noted.  The patient was taken to the recovery room in stable condition without complications of anesthesia or surgery  Willey BladeBradley King 11/15/2017, 9:33 AM

## 2017-11-15 NOTE — Anesthesia Post-op Follow-up Note (Signed)
Anesthesia QCDR form completed.        

## 2017-11-15 NOTE — Anesthesia Preprocedure Evaluation (Signed)
Anesthesia Evaluation  Patient identified by MRN, date of birth, ID band Patient awake    Reviewed: Allergy & Precautions, NPO status , Patient's Chart, lab work & pertinent test results  History of Anesthesia Complications Negative for: history of anesthetic complications  Airway Mallampati: III       Dental   Pulmonary sleep apnea and Continuous Positive Airway Pressure Ventilation , neg COPD, former smoker,           Cardiovascular hypertension, Pt. on medications (-) Past MI and (-) CHF (-) dysrhythmias (-) Valvular Problems/Murmurs     Neuro/Psych neg Seizures Anxiety Depression    GI/Hepatic Neg liver ROS, GERD  Medicated,  Endo/Other  neg diabetes  Renal/GU negative Renal ROS     Musculoskeletal   Abdominal   Peds  Hematology  (+) anemia ,   Anesthesia Other Findings   Reproductive/Obstetrics                             Anesthesia Physical Anesthesia Plan  ASA: III  Anesthesia Plan: MAC   Post-op Pain Management:    Induction:   PONV Risk Score and Plan:   Airway Management Planned: Nasal Cannula  Additional Equipment:   Intra-op Plan:   Post-operative Plan:   Informed Consent: I have reviewed the patients History and Physical, chart, labs and discussed the procedure including the risks, benefits and alternatives for the proposed anesthesia with the patient or authorized representative who has indicated his/her understanding and acceptance.     Plan Discussed with:   Anesthesia Plan Comments:         Anesthesia Quick Evaluation

## 2017-11-15 NOTE — H&P (Signed)
The History and Physical notes are on paper, have been signed, and are to be scanned.   I have examined the patient and there are no changes to the H&P.   Willey BladeBradley Mahmoud Blazejewski 11/15/2017 9:03 AM

## 2017-11-15 NOTE — Transfer of Care (Signed)
Immediate Anesthesia Transfer of Care Note  Patient: Elizabeth Yu  Procedure(s) Performed: CATARACT EXTRACTION PHACO AND INTRAOCULAR LENS PLACEMENT (IOC) (Right Eye)  Patient Location: PACU  Anesthesia Type:MAC  Level of Consciousness: awake, alert  and oriented  Airway & Oxygen Therapy: Patient Spontanous Breathing  Post-op Assessment: Report given to RN and Post -op Vital signs reviewed and stable  Post vital signs: Reviewed and stable  Last Vitals:  Vitals Value Taken Time  BP 125/75 11/15/2017  9:34 AM  Temp 36.8 C 11/15/2017  9:34 AM  Pulse 71 11/15/2017  9:34 AM  Resp 16 11/15/2017  9:34 AM  SpO2 99 % 11/15/2017  9:34 AM    Last Pain:  Vitals:   11/15/17 0805  TempSrc: Oral         Complications: No apparent anesthesia complications

## 2017-11-19 NOTE — Anesthesia Postprocedure Evaluation (Signed)
Anesthesia Post Note  Patient: Elizabeth Yu  Procedure(s) Performed: CATARACT EXTRACTION PHACO AND INTRAOCULAR LENS PLACEMENT (Onward) (Right Eye)  Patient location during evaluation: PACU Anesthesia Type: MAC Level of consciousness: awake, awake and alert and oriented Pain management: pain level controlled Respiratory status: spontaneous breathing Cardiovascular status: blood pressure returned to baseline Postop Assessment: no headache Anesthetic complications: no     Last Vitals:  Vitals:   11/15/17 0934 11/15/17 0938  BP: 125/75 119/76  Pulse: 71   Resp: 16   Temp: 36.8 C   SpO2: 99%     Last Pain:  Vitals:   11/15/17 0805  TempSrc: Oral                 Philbert Riser

## 2018-01-15 DIAGNOSIS — G4733 Obstructive sleep apnea (adult) (pediatric): Secondary | ICD-10-CM | POA: Insufficient documentation

## 2018-08-12 ENCOUNTER — Encounter: Payer: Self-pay | Admitting: Emergency Medicine

## 2018-08-12 ENCOUNTER — Emergency Department
Admission: EM | Admit: 2018-08-12 | Discharge: 2018-08-12 | Disposition: A | Discharge status: Home / Self Care | Payer: 59 | Attending: Emergency Medicine | Admitting: Emergency Medicine

## 2018-08-12 ENCOUNTER — Other Ambulatory Visit: Payer: Self-pay

## 2018-08-12 ENCOUNTER — Emergency Department: Payer: 59

## 2018-08-12 DIAGNOSIS — R1031 Right lower quadrant pain: Secondary | ICD-10-CM | POA: Diagnosis not present

## 2018-08-12 DIAGNOSIS — Z87891 Personal history of nicotine dependence: Secondary | ICD-10-CM | POA: Diagnosis not present

## 2018-08-12 DIAGNOSIS — N9489 Other specified conditions associated with female genital organs and menstrual cycle: Secondary | ICD-10-CM | POA: Diagnosis not present

## 2018-08-12 DIAGNOSIS — N838 Other noninflammatory disorders of ovary, fallopian tube and broad ligament: Secondary | ICD-10-CM

## 2018-08-12 DIAGNOSIS — I10 Essential (primary) hypertension: Secondary | ICD-10-CM | POA: Diagnosis not present

## 2018-08-12 DIAGNOSIS — Z79899 Other long term (current) drug therapy: Secondary | ICD-10-CM | POA: Diagnosis not present

## 2018-08-12 LAB — URINALYSIS, COMPLETE (UACMP) WITH MICROSCOPIC
Bacteria, UA: NONE SEEN
Bilirubin Urine: NEGATIVE
GLUCOSE, UA: NEGATIVE mg/dL
HGB URINE DIPSTICK: NEGATIVE
Ketones, ur: NEGATIVE mg/dL
Leukocytes, UA: NEGATIVE
NITRITE: NEGATIVE
Protein, ur: NEGATIVE mg/dL
Specific Gravity, Urine: 1.013 (ref 1.005–1.030)
pH: 7 (ref 5.0–8.0)

## 2018-08-12 LAB — CBC
HCT: 30.6 % — ABNORMAL LOW (ref 36.0–46.0)
Hemoglobin: 9.5 g/dL — ABNORMAL LOW (ref 12.0–15.0)
MCH: 21.1 pg — ABNORMAL LOW (ref 26.0–34.0)
MCHC: 31 g/dL (ref 30.0–36.0)
MCV: 68 fL — ABNORMAL LOW (ref 80.0–100.0)
Platelets: 422 10*3/uL — ABNORMAL HIGH (ref 150–400)
RBC: 4.5 MIL/uL (ref 3.87–5.11)
RDW: 17.5 % — AB (ref 11.5–15.5)
WBC: 11.1 10*3/uL — AB (ref 4.0–10.5)
nRBC: 0 % (ref 0.0–0.2)

## 2018-08-12 LAB — COMPREHENSIVE METABOLIC PANEL
ALT: 14 U/L (ref 0–44)
AST: 19 U/L (ref 15–41)
Albumin: 3.6 g/dL (ref 3.5–5.0)
Alkaline Phosphatase: 101 U/L (ref 38–126)
Anion gap: 13 (ref 5–15)
BILIRUBIN TOTAL: 0.6 mg/dL (ref 0.3–1.2)
BUN: 16 mg/dL (ref 8–23)
CO2: 22 mmol/L (ref 22–32)
Calcium: 8.6 mg/dL — ABNORMAL LOW (ref 8.9–10.3)
Chloride: 93 mmol/L — ABNORMAL LOW (ref 98–111)
Creatinine, Ser: 0.96 mg/dL (ref 0.44–1.00)
GFR calc Af Amer: >60 mL/min (ref >60)
Glucose, Bld: 119 mg/dL — ABNORMAL HIGH (ref 70–99)
POTASSIUM: 3.1 mmol/L — AB (ref 3.5–5.1)
Sodium: 128 mmol/L — ABNORMAL LOW (ref 135–145)
TOTAL PROTEIN: 7.2 g/dL (ref 6.5–8.1)

## 2018-08-12 LAB — LIPASE, BLOOD: Lipase: 26 U/L (ref 11–51)

## 2018-08-12 MED ORDER — OXYCODONE HCL 5 MG PO TABS: 5.0000 mg | ORAL_TABLET | Freq: Three times a day (TID) | ORAL | 0 refills | Status: AC | PRN

## 2018-08-12 MED ORDER — ONDANSETRON HCL 4 MG/2ML IJ SOLN
4.0000 mg | Freq: Once | INTRAMUSCULAR | Status: AC
  Administered 2018-08-12: 4 mg via INTRAVENOUS
  Filled 2018-08-12: qty 2

## 2018-08-12 MED ORDER — OXYCODONE HCL 5 MG PO TABS
5.0000 mg | ORAL_TABLET | Freq: Once | ORAL | Status: AC
  Administered 2018-08-12: 5 mg via ORAL
  Filled 2018-08-12: qty 1

## 2018-08-12 MED ORDER — SODIUM CHLORIDE 0.9 % IV BOLUS
1000.0000 mL | Freq: Once | INTRAVENOUS | Status: AC
  Administered 2018-08-12: 1000 mL via INTRAVENOUS

## 2018-08-12 MED ORDER — MORPHINE SULFATE (PF) 4 MG/ML IV SOLN
4.0000 mg | Freq: Once | INTRAVENOUS | Status: AC
  Administered 2018-08-12: 4 mg via INTRAVENOUS
  Filled 2018-08-12: qty 1

## 2018-08-12 MED ORDER — FENTANYL CITRATE (PF) 100 MCG/2ML IJ SOLN
INTRAMUSCULAR | Status: AC
  Filled 2018-08-12: qty 2

## 2018-08-12 MED ORDER — ACETAMINOPHEN 500 MG PO TABS
1000.0000 mg | ORAL_TABLET | Freq: Once | ORAL | Status: AC
  Administered 2018-08-12: 1000 mg via ORAL
  Filled 2018-08-12: qty 2

## 2018-08-12 MED ORDER — ONDANSETRON 4 MG PO TBDP: 4.0000 mg | ORAL_TABLET | Freq: Three times a day (TID) | ORAL | 0 refills | Status: DC | PRN

## 2018-08-12 MED ORDER — FENTANYL CITRATE (PF) 100 MCG/2ML IJ SOLN
50.0000 ug | INTRAMUSCULAR | Status: DC | PRN
  Administered 2018-08-12: 50 ug via NASAL

## 2018-08-12 MED ORDER — IOPAMIDOL (ISOVUE-300) INJECTION 61%
100.0000 mL | Freq: Once | INTRAVENOUS | Status: AC | PRN
  Administered 2018-08-12: 100 mL via INTRAVENOUS

## 2018-08-12 NOTE — ED Notes (Signed)
Pt unable to urinate at this time, given specimen cup for when is able to void.  

## 2018-08-12 NOTE — ED Notes (Signed)
Pt back from US

## 2018-08-12 NOTE — ED Provider Notes (Signed)
[facility] Emergency Department Provider Note  ____________________________________________  Time seen: Approximately 10:45 AM  I have reviewed the triage vital signs and the nursing notes.   HISTORY  Chief Complaint Abdominal Pain   HPI Margi Jannett CelestineS Lanuza is a 62 y.o. female with a history of GERD and hypertension who presents for evaluation of abdominal pain.  Patient reports the pain started yesterday evening.  The pain is throbbing and initially severe.  She reports that the pain initially was in the right upper quadrant and radiating up to her shoulder however throughout the night the pain has migrated down and is now located in the right lower quadrant.  After receiving fentanyl in the waiting room patient's pain is now 7 out of 10.  She has had nausea and one episode of nonbloody nonbilious emesis associated with it.  Patient has been on narcotics after breaking her right wrist but denies constipation.  She does endorse her stool has been hard but has not been constipated.  She is passing gas.  She has had a prior hysterectomy in the past but denies any prior SBO's.  She denies dysuria or hematuria, chest pain or shortness of breath..  Past Medical History:  Diagnosis Date  . Anemia   . Anxiety   . Arthritis   . Depression   . Edema    ANKLES OCCAS  . GERD (gastroesophageal reflux disease)   . Hypertension   . Sleep apnea    CPAP    There are no active problems to display for this patient.   Past Surgical History:  Procedure Laterality Date  . ABDOMINAL HYSTERECTOMY    . CATARACT EXTRACTION W/PHACO Left 10/18/2017   Procedure: CATARACT EXTRACTION PHACO AND INTRAOCULAR LENS PLACEMENT (IOC);  Surgeon: Nevada CraneKing, Bradley Mark, MD;  Location: [facility] ORS;  Service: Ophthalmology;  Laterality: Left;  Lot #[id number] H US: 00:19.1 AP%: 7.3 CDE: 1.38  . CATARACT EXTRACTION W/PHACO Right 11/15/2017   Procedure: CATARACT EXTRACTION PHACO AND INTRAOCULAR LENS  PLACEMENT (IOC);  Surgeon: Nevada CraneKing, Bradley Mark, MD;  Location: [facility] ORS;  Service: Ophthalmology;  Laterality: Right;  Lot # [id number] H US: 00:09.9 AP%: 7.0 CDE: 0.69  . ESOPHAGOGASTRODUODENOSCOPY N/A 02/12/2015   Procedure: ESOPHAGOGASTRODUODENOSCOPY (EGD);  Surgeon: Scot Junobert T Elliott, MD;  [facility];  Service: Endoscopy;  Laterality: N/A;  . FRACTURE SURGERY Right    PLATE AND 13 SCREWS  . HERNIA REPAIR    . ORIF WRIST FRACTURE Right 12/12/2012   Procedure: OPEN REDUCTION INTERNAL FIXATION (ORIF) WRIST FRACTURE Right ;  Surgeon: Johnette AbrahamHarrill C Coley, MD;  Location: [facility] OR;  Service: Plastics;  Laterality: Right;  . ROTATOR CUFF REPAIR    . TONSILLECTOMY    . TUBAL LIGATION      Prior to Admission medications   Medication Sig Start Date End Date Taking? Authorizing Provider  citalopram (CELEXA) 20 MG tablet Take 20 mg by mouth daily.   Yes [provider]  docusate sodium (COLACE) 100 MG capsule Take 100 mg by mouth daily.   Yes [provider]  esomeprazole (NEXIUM) 20 MG capsule Take 20 mg by mouth 2 (two) times daily before a meal. Breakfast & supper   Yes [provider]  estradiol (ESTRACE) 1 MG tablet Take 1 mg by mouth daily.   Yes [provider]  fexofenadine (ALLEGRA) 180 MG tablet Take 180 mg by mouth 2 (two) times daily.   Yes [provider]  HYDROcodone-acetaminophen (NORCO) 10-325 MG tablet Take 1 tablet by  mouth every 6 (six) hours as needed.   Yes [provider]  triamterene-hydrochlorothiazide (DYAZIDE) 37.5-25 MG capsule Take 1 capsule by mouth daily. 09/10/17  Yes [provider]  NON FORMULARY Apply 1 drop to eye See admin instructions. Pred-Gati-Brom (Prednisolone Acetate/Gatifloxacin/Bromfenac) 1 drop into left eye twice daily  Then after procedure patient will use into right eye 4x's daily x 1 week, then twice daily until bottle is finished.    [provider]  ondansetron (ZOFRAN ODT) 4 MG  disintegrating tablet Take 1 tablet (4 mg total) by mouth every 8 (eight) hours as needed. 08/12/18   Nita Sickle, MD  oxyCODONE (ROXICODONE) 5 MG immediate release tablet Take 1 tablet (5 mg total) by mouth every 8 (eight) hours as needed. 08/12/18 08/12/19  Nita Sickle, MD    Allergies Patient has no known allergies.  No family history on file.  Social History Social History   Tobacco Use  . Smoking status: Former Smoker    Packs/day: 1.00    Years: 30.00    Pack years: 30.00    Types: Cigarettes    Last attempt to quit: 07/2002    Years since quitting: 16.0  . Smokeless tobacco: Never Used  Substance Use Topics  . Alcohol use: Yes    Comment: occasional  . Drug use: No    Review of Systems  Constitutional: Negative for fever. Eyes: Negative for visual changes. ENT: Negative for sore throat. Neck: No neck pain  Cardiovascular: Negative for chest pain. Respiratory: Negative for shortness of breath. Gastrointestinal: + RLQ abdominal pain, nausea, and vomiting. No diarrhea. Genitourinary: Negative for dysuria. Musculoskeletal: Negative for back pain. Skin: Negative for rash. Neurological: Negative for headaches, weakness or numbness. Psych: No SI or HI  ____________________________________________   PHYSICAL EXAM:  VITAL SIGNS: ED Triage Vitals  Enc Vitals Group     BP 08/12/18 0938 (!) 146/87     Pulse Rate 08/12/18 0938 62     Resp 08/12/18 0938 16     Temp 08/12/18 0938 98.7 F (37.1 C)     Temp src --      SpO2 08/12/18 0938 100 %     Weight 08/12/18 0940 204 lb (92.5 kg)     Height 08/12/18 0940 5' 1.5" (1.562 m)     Head Circumference --      Peak Flow --      Pain Score 08/12/18 0939 10     Pain Loc --      Pain Edu? --      Excl. in GC? --     Constitutional: Alert and oriented. Well appearing and in no apparent distress. HEENT:      Head: Normocephalic and atraumatic.         Eyes: Conjunctivae are normal. Sclera is  non-icteric.       Mouth/Throat: Mucous membranes are moist.       Neck: Supple with no signs of meningismus. Cardiovascular: Regular rate and rhythm. No murmurs, gallops, or rubs. 2+ symmetrical distal pulses are present in all extremities. No JVD. Respiratory: Normal respiratory effort. Lungs are clear to auscultation bilaterally. No wheezes, crackles, or rhonchi.  Gastrointestinal: Obese, distended, ttp over the RLQ with positive Rovsing sign, positive bowel sounds. No rebound or guarding. Genitourinary: No CVA tenderness. Musculoskeletal: Nontender with normal range of motion in all extremities. No edema, cyanosis, or erythema of extremities. Neurologic: Normal speech and language. Face is symmetric. Moving all extremities. No gross focal neurologic deficits are appreciated.  Skin: Skin is warm, dry and intact. No rash noted. Psychiatric: Mood and affect are normal. Speech and behavior are normal.  ____________________________________________   LABS (all labs ordered are listed, but only abnormal results are displayed)  Labs Reviewed  COMPREHENSIVE METABOLIC PANEL - Abnormal; Notable for the following components:      Result Value   Sodium 128 (*)    Potassium 3.1 (*)    Chloride 93 (*)    Glucose, Bld 119 (*)    Calcium 8.6 (*)    All other components within normal limits  CBC - Abnormal; Notable for the following components:   WBC 11.1 (*)    Hemoglobin 9.5 (*)    HCT 30.6 (*)    MCV 68.0 (*)    MCH 21.1 (*)    RDW 17.5 (*)    Platelets 422 (*)    All other components within normal limits  URINALYSIS, COMPLETE (UACMP) WITH MICROSCOPIC - Abnormal; Notable for the following components:   Color, Urine STRAW (*)    APPearance CLEAR (*)    All other components within normal limits  LIPASE, BLOOD  CA 125, SERUM (SERIAL)   ____________________________________________  EKG  none  ____________________________________________  RADIOLOGY  I have personally reviewed the  images performed during this visit and I agree with the Radiologist's read.   Interpretation by Radiologist:  Ct Abdomen Pelvis W Contrast  Result Date: 08/12/2018 CLINICAL DATA:  Abdominal pain with vomiting last night. EXAM: CT ABDOMEN AND PELVIS WITH CONTRAST TECHNIQUE: Multidetector CT imaging of the abdomen and pelvis was performed using the standard protocol following bolus administration of intravenous contrast. CONTRAST:  ISOVUE-300 IOPAMIDOL (ISOVUE-300) INJECTION 61% COMPARISON:  Abdomen and pelvis CT report 12/12/2012, images not available FINDINGS: Lower chest:  Mild scarring at the lingula. Small sliding hiatal hernia.  Suspect prior Nissen fundoplication. Hepatobiliary: No focal liver abnormality.No evidence of biliary obstruction or stone. Pancreas: Unremarkable. Spleen: Unremarkable. Adrenals/Urinary Tract: Negative adrenals. No hydronephrosis or stone. Small right renal cystic density. Unremarkable bladder. Stomach/Bowel:  No obstruction. No appendicitis. Vascular/Lymphatic: No acute vascular abnormality. Atherosclerotic calcification. No mass or adenopathy. Reproductive:Cystic mass in the pelvis and lower abdomen measuring 13 cm with thick but smooth wall. This is intimately associated with the right ovary, which also contains a more lateral 3.3 cm cyst. The ovarian pedicle is distorted and thickened. 2.7 cm left ovarian cyst, simple appearing. Other: Trace ascites seen about the liver, spleen, in the pelvis. No superimposed peritoneal nodularity is noted. Musculoskeletal: Lower lumbar facet degeneration with L3-4 anterolisthesis. There is a transitional L5 vertebra based on the lowest ribs. Critical Value/emergent results were called by telephone at the time of interpretation on 08/12/2018 at 11:43 am to Dr. Nita Sickle , who verbally acknowledged these results. IMPRESSION: 1. 13 cm thick walled central abdominal cyst likely arising from the thickened right ovary. Given location  and thickening of the vascular pedicle recommend sonography to evaluate for torsion. 2. Small volume ascites that could be acute and reactive. There is no superimposed nodularity, but need gynecology follow-up as a cyst of this size in the ovary is concerning for epithelial neoplasm. Electronically Signed   By: Marnee Spring M.D.   On: 08/12/2018 11:43   US Pelvic Doppler (torsion R/o Or Mass Arterial Flow)  Result Date: 08/12/2018 CLINICAL DATA:  62 y/o F; right lower quadrant abdominal pain for 1 day. History of hysterectomy. EXAM: TRANSABDOMINAL ULTRASOUND OF PELVIS DOPPLER ULTRASOUND OF OVARIES TECHNIQUE: Transabdominal ultrasound examination of the pelvis  was performed including evaluation of the uterus, ovaries, adnexal regions, and pelvic cul-de-sac. Color and duplex Doppler ultrasound was utilized to evaluate blood flow to the ovaries. COMPARISON:  08/12/2018 CT abdomen and pelvis. FINDINGS: Uterus Hysterectomy. Endometrium Hysterectomy. Right ovary Measurements: 5.1 x 3.0 x 3.1 cm = volume: 24.5 mL. Right adnexal cyst measuring 13.7 x 11 5 x 13.0 cm. 12 mm mural nodule (seen a series 1, image 25/245). Left ovary Measurements: 4.0 x 2.9 x 2.3 cm = volume: 15.8 mL. Left ovary simple cyst measuring up to 2.8 cm. Pulsed Doppler evaluation demonstrates normal low-resistance arterial and venous waveforms in both ovaries. Other: Negative. IMPRESSION: Right adnexal cystic lesion measuring up to 13.7 cm with mural nodule which suggest neoplasm. Surgical consultation recommended. This recommendation follows the consensus statement: Management of Asymptomatic Ovarian and Other Adnexal Cysts Imaged at [place]: Society of Radiologists in Ultrasound Consensus Conference Statement. Radiology 2010; [phone]. Electronically Signed   By: Mitzi Hansen M.D.   On: 08/12/2018 15:09   US Pelvic Complete With Transvaginal  Result Date: 08/12/2018 CLINICAL DATA:  62 y/o F; right lower quadrant abdominal pain  for 1 day. History of hysterectomy. EXAM: TRANSABDOMINAL ULTRASOUND OF PELVIS DOPPLER ULTRASOUND OF OVARIES TECHNIQUE: Transabdominal ultrasound examination of the pelvis was performed including evaluation of the uterus, ovaries, adnexal regions, and pelvic cul-de-sac. Color and duplex Doppler ultrasound was utilized to evaluate blood flow to the ovaries. COMPARISON:  08/12/2018 CT abdomen and pelvis. FINDINGS: Uterus Hysterectomy. Endometrium Hysterectomy. Right ovary Measurements: 5.1 x 3.0 x 3.1 cm = volume: 24.5 mL. Right adnexal cyst measuring 13.7 x 11 5 x 13.0 cm. 12 mm mural nodule (seen a series 1, image 25/245). Left ovary Measurements: 4.0 x 2.9 x 2.3 cm = volume: 15.8 mL. Left ovary simple cyst measuring up to 2.8 cm. Pulsed Doppler evaluation demonstrates normal low-resistance arterial and venous waveforms in both ovaries. Other: Negative. IMPRESSION: Right adnexal cystic lesion measuring up to 13.7 cm with mural nodule which suggest neoplasm. Surgical consultation recommended. This recommendation follows the consensus statement: Management of Asymptomatic Ovarian and Other Adnexal Cysts Imaged at [place]: Society of Radiologists in Ultrasound Consensus Conference Statement. Radiology 2010; [phone]. Electronically Signed   By: Mitzi Hansen M.D.   On: 08/12/2018 15:09      ____________________________________________   PROCEDURES  Procedure(s) performed: None Procedures Critical Care performed:  None ____________________________________________   INITIAL IMPRESSION / ASSESSMENT AND PLAN / ED COURSE   62 y.o. female with a history of GERD and hypertension who presents for evaluation of abdominal pain.  Patient looks uncomfortable but in no distress, abdomen is obese, distended with positive bowel sounds, positive Rovsing sign and right lower quadrant tenderness. Ddx appendicitis, kidney stone, gallbladder disease, SBO, constipation.  Labs showing mild leukocytosis and  hyponatremia.  Will give IV fluids.  UA is pending.  Clinical Course as of Aug 12 1528  Mon Aug 12, 2018  1147 CT concerning for a very large ovarian mass.  Radiologist concerning for possible torsion versus malignancy versus cyst.  Recommended Doppler transvaginal ultrasound which has been ordered.  Patient has been updated.   [CV]    Clinical Course User Index [CV] Don Perking Washington, MD   _________________________ 3:15 PM on 08/12/2018 -----------------------------------------  [place] concerning for ovarian neoplasm. Consulted Dr. Dalbert Garnet from ObGYN who recommended outpatient follow-up.  She also requested that I send a Ca1 25 which has been sent.  Will provide patient with narcotics for pain control.  Discussed these findings and plan with  the patient. Her pain is well controlled at this time.  As part of my medical decision making, I reviewed the following data within the electronic MEDICAL RECORD NUMBER Nursing notes reviewed and incorporated, Labs reviewed , Old chart reviewed, Radiograph reviewed , A consult was requested and obtained from this/these consultant(s) ObGYN, Notes from prior ED visits and [place] Controlled Substance Database    Pertinent labs & imaging results that were available during my care of the patient were reviewed by me and considered in my medical decision making (see chart for details).    ____________________________________________   FINAL CLINICAL IMPRESSION(S) / ED DIAGNOSES  Final diagnoses:  RLQ abdominal pain  Ovarian mass, right      NEW MEDICATIONS STARTED DURING THIS VISIT:  ED Discharge Orders         Ordered    oxyCODONE (ROXICODONE) 5 MG immediate release tablet  Every 8 hours PRN     08/12/18 1526    ondansetron (ZOFRAN ODT) 4 MG disintegrating tablet  Every 8 hours PRN     08/12/18 1526           Note:  This document was prepared using Dragon voice recognition software and may include unintentional dictation errors.    Nita Sickle, MD 08/12/18 [phone]

## 2018-08-12 NOTE — Discharge Instructions (Addendum)
US concerning for ovaria[place] cancer.  Make sure to follow-up with OB/GYN.  Return to the emergency room if you have severe worsening pain, vomiting, fever.  Pain control: Take tylenol 1000mg  every 8 hours. Take 5mg  of oxycodone every 6 hours for breakthrough pain. If you need the oxycodone make sure to take one senokot as well to prevent constipation.  Do not drink alcohol, drive or participate in any other potentially dangerous activities while taking this medication as it may make you sleepy. Do not take this medication with any other sedating medications, either prescription or over-the-counter.

## 2018-08-12 NOTE — ED Notes (Signed)
Pt back from CT

## 2018-08-12 NOTE — ED Notes (Signed)
Pt transported to CT ?

## 2018-08-12 NOTE — ED Triage Notes (Signed)
Had hand surgery in November and has been taking pain medication since that time.  Has been taking stool softeners with pain meds.  States last night had RUQ pain and emesis.  Now pain is right lower abdominal pain.

## 2018-08-12 NOTE — ED Notes (Signed)
Patient transported to Ultrasound 

## 2018-08-12 NOTE — ED Notes (Signed)
Pt up to toilet 

## 2018-08-13 LAB — CA 125, SERUM (SERIAL): Cancer Antigen (CA) 125: 16.8 U/mL (ref 0.0–38.1)

## 2018-08-14 ENCOUNTER — Observation Stay
Admission: AD | Admit: 2018-08-14 | Discharge: 2018-08-16 | Disposition: A | Discharge status: Home / Self Care | Payer: 59 | Source: Ambulatory Visit | Attending: Obstetrics and Gynecology | Admitting: Obstetrics and Gynecology

## 2018-08-14 ENCOUNTER — Other Ambulatory Visit: Payer: Self-pay

## 2018-08-14 DIAGNOSIS — N9489 Other specified conditions associated with female genital organs and menstrual cycle: Secondary | ICD-10-CM | POA: Diagnosis present

## 2018-08-14 DIAGNOSIS — I10 Essential (primary) hypertension: Secondary | ICD-10-CM | POA: Insufficient documentation

## 2018-08-14 DIAGNOSIS — R102 Pelvic and perineal pain: Secondary | ICD-10-CM

## 2018-08-14 DIAGNOSIS — F329 Major depressive disorder, single episode, unspecified: Secondary | ICD-10-CM | POA: Insufficient documentation

## 2018-08-14 DIAGNOSIS — G4730 Sleep apnea, unspecified: Secondary | ICD-10-CM | POA: Insufficient documentation

## 2018-08-14 DIAGNOSIS — K5900 Constipation, unspecified: Secondary | ICD-10-CM | POA: Diagnosis not present

## 2018-08-14 DIAGNOSIS — M797 Fibromyalgia: Secondary | ICD-10-CM | POA: Insufficient documentation

## 2018-08-14 DIAGNOSIS — R188 Other ascites: Secondary | ICD-10-CM | POA: Insufficient documentation

## 2018-08-14 DIAGNOSIS — N83511 Torsion of right ovary and ovarian pedicle: Secondary | ICD-10-CM | POA: Diagnosis not present

## 2018-08-14 DIAGNOSIS — N838 Other noninflammatory disorders of ovary, fallopian tube and broad ligament: Secondary | ICD-10-CM | POA: Diagnosis not present

## 2018-08-14 DIAGNOSIS — Z87891 Personal history of nicotine dependence: Secondary | ICD-10-CM | POA: Insufficient documentation

## 2018-08-14 DIAGNOSIS — Z79899 Other long term (current) drug therapy: Secondary | ICD-10-CM | POA: Diagnosis not present

## 2018-08-14 LAB — CBC WITH DIFFERENTIAL/PLATELET
Abs Immature Granulocytes: 0.13 10*3/uL — ABNORMAL HIGH (ref 0.00–0.07)
Basophils Absolute: 0 10*3/uL (ref 0.0–0.1)
Basophils Relative: 0 %
Eosinophils Absolute: 0.1 10*3/uL (ref 0.0–0.5)
Eosinophils Relative: 0 %
HCT: 25.5 % — ABNORMAL LOW (ref 36.0–46.0)
Hemoglobin: 7.6 g/dL — ABNORMAL LOW (ref 12.0–15.0)
IMMATURE GRANULOCYTES: 1 %
LYMPHS ABS: 1.5 10*3/uL (ref 0.7–4.0)
Lymphocytes Relative: 10 %
MCH: 21.1 pg — ABNORMAL LOW (ref 26.0–34.0)
MCHC: 29.8 g/dL — ABNORMAL LOW (ref 30.0–36.0)
MCV: 70.8 fL — ABNORMAL LOW (ref 80.0–100.0)
Monocytes Absolute: 1 10*3/uL (ref 0.1–1.0)
Monocytes Relative: 6 %
Neutro Abs: 13.2 10*3/uL — ABNORMAL HIGH (ref 1.7–7.7)
Neutrophils Relative %: 83 %
Platelets: 329 10*3/uL (ref 150–400)
RBC: 3.6 MIL/uL — ABNORMAL LOW (ref 3.87–5.11)
RDW: 18.2 % — ABNORMAL HIGH (ref 11.5–15.5)
WBC: 15.9 10*3/uL — ABNORMAL HIGH (ref 4.0–10.5)
nRBC: 0 % (ref 0.0–0.2)

## 2018-08-14 LAB — URINALYSIS, ROUTINE W REFLEX MICROSCOPIC
Bilirubin Urine: NEGATIVE
Glucose, UA: NEGATIVE mg/dL
Hgb urine dipstick: NEGATIVE
Ketones, ur: NEGATIVE mg/dL
Leukocytes, UA: NEGATIVE
Nitrite: NEGATIVE
Protein, ur: NEGATIVE mg/dL
Specific Gravity, Urine: 1.006 (ref 1.005–1.030)
pH: 6 (ref 5.0–8.0)

## 2018-08-14 LAB — COMPREHENSIVE METABOLIC PANEL
ALT: 9 U/L (ref 0–44)
AST: 11 U/L — ABNORMAL LOW (ref 15–41)
Albumin: 3.2 g/dL — ABNORMAL LOW (ref 3.5–5.0)
Alkaline Phosphatase: 97 U/L (ref 38–126)
Anion gap: 10 (ref 5–15)
BUN: 9 mg/dL (ref 8–23)
CO2: 25 mmol/L (ref 22–32)
Calcium: 8.1 mg/dL — ABNORMAL LOW (ref 8.9–10.3)
Chloride: 94 mmol/L — ABNORMAL LOW (ref 98–111)
Creatinine, Ser: 1.05 mg/dL — ABNORMAL HIGH (ref 0.44–1.00)
GFR calc Af Amer: >60 mL/min (ref >60)
GFR calc non Af Amer: 56 mL/min — ABNORMAL LOW (ref >60)
Glucose, Bld: 145 mg/dL — ABNORMAL HIGH (ref 70–99)
Potassium: 2.8 mmol/L — ABNORMAL LOW (ref 3.5–5.1)
Sodium: 129 mmol/L — ABNORMAL LOW (ref 135–145)
Total Bilirubin: 0.6 mg/dL (ref 0.3–1.2)
Total Protein: 7 g/dL (ref 6.5–8.1)

## 2018-08-14 LAB — TYPE AND SCREEN
ABO/RH(D): A NEG
Antibody Screen: NEGATIVE

## 2018-08-14 MED ORDER — OXYCODONE-ACETAMINOPHEN 5-325 MG PO TABS
1.0000 | ORAL_TABLET | ORAL | Status: DC | PRN
  Administered 2018-08-14 (×2): 2 via ORAL
  Administered 2018-08-15 (×2): 1 via ORAL
  Administered 2018-08-16: 2 via ORAL
  Administered 2018-08-16 (×2): 1 via ORAL
  Filled 2018-08-14: qty 1
  Filled 2018-08-14: qty 2
  Filled 2018-08-14: qty 1
  Filled 2018-08-14 (×2): qty 2
  Filled 2018-08-14 (×2): qty 1

## 2018-08-14 MED ORDER — LACTATED RINGERS IV SOLN: INTRAVENOUS | Status: DC

## 2018-08-14 MED ORDER — HYDROMORPHONE HCL 1 MG/ML IJ SOLN
0.2000 mg | INTRAMUSCULAR | Status: DC | PRN
  Administered 2018-08-15: 0.6 mg via INTRAVENOUS
  Administered 2018-08-15 (×2): 0.5 mg via INTRAVENOUS
  Administered 2018-08-15: 0.6 mg via INTRAVENOUS
  Filled 2018-08-14 (×2): qty 1

## 2018-08-14 MED ORDER — ALUM & MAG HYDROXIDE-SIMETH 200-200-20 MG/5ML PO SUSP: 30.0000 mL | ORAL | Status: DC | PRN

## 2018-08-14 MED ORDER — ESTRADIOL 1 MG PO TABS
1.0000 mg | ORAL_TABLET | Freq: Every day | ORAL | Status: DC
  Administered 2018-08-14 – 2018-08-16 (×2): 1 mg via ORAL
  Filled 2018-08-14 (×4): qty 1

## 2018-08-14 MED ORDER — ONDANSETRON HCL 4 MG PO TABS
4.0000 mg | ORAL_TABLET | Freq: Four times a day (QID) | ORAL | Status: DC | PRN
  Administered 2018-08-14 – 2018-08-16 (×2): 4 mg via ORAL
  Filled 2018-08-14 (×2): qty 1

## 2018-08-14 MED ORDER — PANTOPRAZOLE SODIUM 40 MG PO TBEC
40.0000 mg | DELAYED_RELEASE_TABLET | Freq: Two times a day (BID) | ORAL | Status: DC
  Administered 2018-08-14 – 2018-08-16 (×3): 40 mg via ORAL
  Filled 2018-08-14 (×3): qty 1

## 2018-08-14 MED ORDER — LACTATED RINGERS IV SOLN
INTRAVENOUS | Status: DC
  Administered 2018-08-14 – 2018-08-15 (×3): via INTRAVENOUS

## 2018-08-14 MED ORDER — PANTOPRAZOLE SODIUM 40 MG PO PACK
40.0000 mg | PACK | Freq: Two times a day (BID) | ORAL | Status: DC
  Administered 2018-08-14: 40 mg via ORAL
  Filled 2018-08-14 (×3): qty 20

## 2018-08-14 MED ORDER — LORATADINE 10 MG PO TABS
10.0000 mg | ORAL_TABLET | Freq: Two times a day (BID) | ORAL | Status: DC
  Administered 2018-08-14 – 2018-08-16 (×3): 10 mg via ORAL
  Filled 2018-08-14 (×7): qty 1

## 2018-08-14 MED ORDER — GABAPENTIN 300 MG PO CAPS
300.0000 mg | ORAL_CAPSULE | ORAL | Status: AC
  Administered 2018-08-15: 300 mg via ORAL
  Filled 2018-08-14: qty 1

## 2018-08-14 MED ORDER — BISACODYL 5 MG PO TBEC: 5.0000 mg | DELAYED_RELEASE_TABLET | Freq: Every day | ORAL | Status: DC | PRN

## 2018-08-14 MED ORDER — IBUPROFEN 600 MG PO TABS
600.0000 mg | ORAL_TABLET | Freq: Four times a day (QID) | ORAL | Status: DC | PRN
  Administered 2018-08-14 – 2018-08-16 (×5): 600 mg via ORAL
  Filled 2018-08-14 (×5): qty 1

## 2018-08-14 MED ORDER — ACETAMINOPHEN 500 MG PO TABS
1000.0000 mg | ORAL_TABLET | ORAL | Status: AC
  Administered 2018-08-15: 1000 mg via ORAL
  Filled 2018-08-14: qty 2

## 2018-08-14 MED ORDER — CELECOXIB 200 MG PO CAPS
400.0000 mg | ORAL_CAPSULE | ORAL | Status: AC
  Administered 2018-08-15: 400 mg via ORAL
  Filled 2018-08-14: qty 2

## 2018-08-14 MED ORDER — MAGNESIUM CITRATE PO SOLN
1.0000 | Freq: Once | ORAL | Status: DC | PRN
  Filled 2018-08-14: qty 296

## 2018-08-14 MED ORDER — DOCUSATE SODIUM 100 MG PO CAPS
100.0000 mg | ORAL_CAPSULE | Freq: Two times a day (BID) | ORAL | Status: DC
  Administered 2018-08-14 – 2018-08-16 (×3): 100 mg via ORAL
  Filled 2018-08-14 (×4): qty 1

## 2018-08-14 MED ORDER — TRIAMTERENE-HCTZ 37.5-25 MG PO TABS
1.0000 | ORAL_TABLET | Freq: Every day | ORAL | Status: DC
  Administered 2018-08-14 – 2018-08-16 (×2): 1 via ORAL
  Filled 2018-08-14 (×4): qty 1

## 2018-08-14 MED ORDER — ZOLPIDEM TARTRATE 5 MG PO TABS: 5.0000 mg | ORAL_TABLET | Freq: Every evening | ORAL | Status: DC | PRN

## 2018-08-14 MED ORDER — ONDANSETRON HCL 4 MG/2ML IJ SOLN
4.0000 mg | Freq: Four times a day (QID) | INTRAMUSCULAR | Status: DC | PRN
  Administered 2018-08-15: 4 mg via INTRAVENOUS

## 2018-08-14 MED ORDER — MAGNESIUM HYDROXIDE 400 MG/5ML PO SUSP: 30.0000 mL | Freq: Every day | ORAL | Status: DC | PRN

## 2018-08-14 MED ORDER — ENOXAPARIN SODIUM 40 MG/0.4ML ~~LOC~~ SOLN
40.0000 mg | SUBCUTANEOUS | Status: AC
  Administered 2018-08-15: 40 mg via SUBCUTANEOUS
  Filled 2018-08-14: qty 0.4

## 2018-08-14 NOTE — H&P (Signed)
Patient ID: Elizabeth [name] is a 62 y.o. [name] with Follow-up (ER follow up right side pain)  on 08/14/2018  HPI: ER f/u for acute right sided pain x3 days, CT with 13cm right ovarian mass confirmed on ultrasound. Small ascites, thickened cyst wall with a nodule, otherwise filled with simple fluid. CA 125 neg.   Pain is 10/10 and has been since Sunday night. She is oxycodone for right wrist surgery and still in significant pain. Unable to use core muscles. Tearful. Present with son who is helping her walk.  No fhx of ovarian, uterine or breast cancer. Some constipation, relieved with OTC methods. Hx of hernia repair, lap hyst in 2007 for fibroids (doesn't remember if morcellation used). Has gained 47 #, no weight loss.   Past Medical History:  has a past medical history of Anemia, Chest pain, Depression, Dislocated shoulder (2007), DOE (dyspnea on exertion) (10/15/2014), Fibroids, Fibromyalgia, Fracture of right wrist, History of tobacco abuse, Hyperplastic colon polyp (02/24/2008), Hypertension, Internal hemorrhoids, Rotator cuff injury (2009), and Sleep apnea.  Past Surgical History:  has a past surgical history that includes Tonsillectomy and adenoidectomy; BILATERAL TUBAL LIGATION; Laparoscopic supracervical hysterectomy; Anterior and posterior vaginal repair; LEFT ROTATOR CUFF SURGERY; Colonoscopy (02/24/2008); egd (09/30/2010); Tracheostomy (01/07/2007); egd (11/05/2014); egd (02/12/2015); and hand surgery (05/18/2018). Family History: family history includes Aortic aneurysm in her mother; Diabetes in her father; Heart disease in her mother and sister; Heart failure in her father; High blood pressure (Hypertension) in her mother; Myocardial Infarction (Heart attack) in her father; No Known Problems in her daughter, sister, sister, and son; Stroke in her mother. Social History:  reports that she quit smoking about 16 years ago. She has a 7.50 pack-year smoking history. She has  never used smokeless tobacco. She reports current alcohol use. OB/GYN History:          OB History    Gravida  2   Para  2   Term  2   Preterm      AB      Living  2     SAB      TAB      Ectopic      Molar      Multiple      Live Births             Allergies: has No Known Allergies. Medications:  Current Outpatient Medications:  .  citalopram (CELEXA) 20 MG tablet, Take 20 mg by mouth 2 (two) times daily., Disp: , Rfl:  .  docusate (COLACE) 100 MG capsule, TAKE 1 CAPSULE(S) EVERY DAY BY ORAL ROUTE FOR 15 DAYS., Disp: , Rfl:  .  estradiol (ESTRACE) 1 MG tablet, , Disp: , Rfl:  .  omeprazole (PRILOSEC) 20 MG DR capsule, TAKE ONE CAPSULE BY MOUTH TWICE A DAY, Disp: 60 capsule, Rfl: 3 .  triamterene-hydrochlorothiazide (DYAZIDE) 37.5-25 mg capsule, Take 1 capsule by mouth once daily  , Disp: , Rfl:  .  oxyCODONE-acetaminophen (PERCOCET) 5-325 mg tablet, TAKE 1 TABLET BY MOUTH EVERY 6 HOURS FOR 5 DAYS, Disp: , Rfl:    Review of Systems: No SOB, no palpitations or chest pain, no new lower extremity edema, no nausea or vomiting or bowel or bladder complaints. See HPI for gyn specific ROS.   Exam:   BP 102/64   Ht 154.9 cm (5\' 1" )   Wt 92.5 kg (204 lb)   BMI 38.55 kg/m   General: Patient is well-groomed, well-nourished, appears stated age in  no acute distress  HEENT: head is atraumatic and normocephalic, trachea is midline, neck is supple with no palpable nodules  CV: Regular rhythm and normal heart rate, no murmur  Pulm: Clear to auscultation throughout lung fields with no wheezing, crackles, or rhonchi. No increased work of breathing  Abdomen: soft , no mass, tender throughout, no rebound tenderness, +Rovsings  Pelvic:  deferred   Impression:   The encounter diagnosis was Acute pelvic pain, [name].    Plan:    Patient returns for a preoperative discussion regarding her plans to proceed with surgical treatment of her  acute 10/10 pain by laparoscopic bilateral oophorectomy with sending ascites and peritoneal biopsies procedure. However, given the size of the mass, the possibility of cancer and her pain, she may need an open oophorectomy. At all costs I will avoid spilling the cyst if I can find a bag large enough, and if not, will perform the surgery open.   We discussed the possibility of cancer and the need for further staging surgeries.   I will admit her for pain control until able to add her on to the OR schedule for above procedures.  The patient and I discussed the technical aspects of the procedure including the potential for risks and complications. These include but are not limited to the risk of infection requiring post-operative antibiotics or further procedures. We talked about the risk of injury to adjacent organs including bladder, bowel, ureter, blood vessels or nerves. We talked about the need to convert to an open incision. We talked about the possible need for blood transfusion. We talked aboutpostop complications such asthromboembolic or cardiopulmonary complications. All of her questions were answered.  Her preoperative exam was completed and the appropriate consents were signed. She is scheduled to undergo this procedure in the near future.  Specific Peri-operative Considerations:  - Consent: obtained today - Health Maintenance: up to date - Labs: CBC, CMP preoperatively - Studies: EKG, CXR preoperatively - Bowel Preparation: None required - Abx:  None indicated - VTE ppx: SCDs perioperatively   Return in about 2 weeks (around 08/28/2018) for Postop check.

## 2018-08-15 ENCOUNTER — Observation Stay: Payer: 59 | Admitting: Anesthesiology

## 2018-08-15 ENCOUNTER — Encounter
Admission: AD | Disposition: A | Discharge status: Home / Self Care | Payer: Self-pay | Source: Ambulatory Visit | Attending: Obstetrics and Gynecology

## 2018-08-15 DIAGNOSIS — N83511 Torsion of right ovary and ovarian pedicle: Secondary | ICD-10-CM | POA: Diagnosis not present

## 2018-08-15 HISTORY — PX: CYSTOSCOPY: SHX5120

## 2018-08-15 LAB — CBC WITH DIFFERENTIAL/PLATELET
Abs Immature Granulocytes: 0.06 10*3/uL (ref 0.00–0.07)
Basophils Absolute: 0 10*3/uL (ref 0.0–0.1)
Basophils Relative: 0 %
Eosinophils Absolute: 0.5 10*3/uL (ref 0.0–0.5)
Eosinophils Relative: 5 %
HCT: 28.7 % — ABNORMAL LOW (ref 36.0–46.0)
Hemoglobin: 8.4 g/dL — ABNORMAL LOW (ref 12.0–15.0)
Immature Granulocytes: 1 %
LYMPHS ABS: 1.8 10*3/uL (ref 0.7–4.0)
Lymphocytes Relative: 16 %
MCH: 21 pg — ABNORMAL LOW (ref 26.0–34.0)
MCHC: 29.3 g/dL — ABNORMAL LOW (ref 30.0–36.0)
MCV: 71.8 fL — AB (ref 80.0–100.0)
MONOS PCT: 6 %
Monocytes Absolute: 0.7 10*3/uL (ref 0.1–1.0)
Neutro Abs: 7.8 10*3/uL — ABNORMAL HIGH (ref 1.7–7.7)
Neutrophils Relative %: 72 %
Platelets: 406 10*3/uL — ABNORMAL HIGH (ref 150–400)
RBC: 4 MIL/uL (ref 3.87–5.11)
RDW: 18 % — ABNORMAL HIGH (ref 11.5–15.5)
WBC: 10.8 10*3/uL — ABNORMAL HIGH (ref 4.0–10.5)
nRBC: 0 % (ref 0.0–0.2)

## 2018-08-15 LAB — BASIC METABOLIC PANEL
Anion gap: 9 (ref 5–15)
BUN: 11 mg/dL (ref 8–23)
CO2: 27 mmol/L (ref 22–32)
Calcium: 8.7 mg/dL — ABNORMAL LOW (ref 8.9–10.3)
Chloride: 98 mmol/L (ref 98–111)
Creatinine, Ser: 1.16 mg/dL — ABNORMAL HIGH (ref 0.44–1.00)
GFR calc Af Amer: 58 mL/min — ABNORMAL LOW (ref >60)
GFR calc non Af Amer: 50 mL/min — ABNORMAL LOW (ref >60)
Glucose, Bld: 120 mg/dL — ABNORMAL HIGH (ref 70–99)
POTASSIUM: 3.2 mmol/L — AB (ref 3.5–5.1)
Sodium: 134 mmol/L — ABNORMAL LOW (ref 135–145)

## 2018-08-15 SURGERY — OOPHORECTOMY, LAPAROSCOPIC
Anesthesia: General | Laterality: Bilateral

## 2018-08-15 MED ORDER — LACTATED RINGERS IV SOLN: INTRAVENOUS | Status: DC

## 2018-08-15 MED ORDER — DOCUSATE SODIUM 100 MG PO CAPS: 100.0000 mg | ORAL_CAPSULE | Freq: Two times a day (BID) | ORAL | 0 refills | Status: AC

## 2018-08-15 MED ORDER — PROMETHAZINE HCL 25 MG/ML IJ SOLN: 6.2500 mg | INTRAMUSCULAR | Status: DC | PRN

## 2018-08-15 MED ORDER — SODIUM CHLORIDE 0.9 % IV SOLN
INTRAVENOUS | Status: DC
  Administered 2018-08-15: 08:00:00 via INTRAVENOUS

## 2018-08-15 MED ORDER — ACETAMINOPHEN 500 MG PO TABS: 1000.0000 mg | ORAL_TABLET | Freq: Four times a day (QID) | ORAL | 0 refills | Status: AC

## 2018-08-15 MED ORDER — SUGAMMADEX SODIUM 200 MG/2ML IV SOLN
INTRAVENOUS | Status: AC
  Filled 2018-08-15: qty 2

## 2018-08-15 MED ORDER — DEXAMETHASONE SODIUM PHOSPHATE 10 MG/ML IJ SOLN
INTRAMUSCULAR | Status: AC
  Filled 2018-08-15: qty 1

## 2018-08-15 MED ORDER — ROCURONIUM BROMIDE 100 MG/10ML IV SOLN
INTRAVENOUS | Status: DC | PRN
  Administered 2018-08-15: 25 mg via INTRAVENOUS
  Administered 2018-08-15: 50 mg via INTRAVENOUS

## 2018-08-15 MED ORDER — DEXAMETHASONE SODIUM PHOSPHATE 10 MG/ML IJ SOLN
INTRAMUSCULAR | Status: DC | PRN
  Administered 2018-08-15: 5 mg via INTRAVENOUS

## 2018-08-15 MED ORDER — PROPOFOL 10 MG/ML IV BOLUS
INTRAVENOUS | Status: AC
  Filled 2018-08-15: qty 20

## 2018-08-15 MED ORDER — PHENYLEPHRINE HCL 10 MG/ML IJ SOLN
INTRAMUSCULAR | Status: DC | PRN
  Administered 2018-08-15 (×2): 100 ug via INTRAVENOUS

## 2018-08-15 MED ORDER — LIDOCAINE HCL (CARDIAC) PF 100 MG/5ML IV SOSY
PREFILLED_SYRINGE | INTRAVENOUS | Status: DC | PRN
  Administered 2018-08-15: 100 mg via INTRAVENOUS

## 2018-08-15 MED ORDER — PROPOFOL 10 MG/ML IV BOLUS
INTRAVENOUS | Status: DC | PRN
  Administered 2018-08-15: 140 mg via INTRAVENOUS

## 2018-08-15 MED ORDER — SUGAMMADEX SODIUM 200 MG/2ML IV SOLN
INTRAVENOUS | Status: DC | PRN
  Administered 2018-08-15: 200 mg via INTRAVENOUS

## 2018-08-15 MED ORDER — LACTATED RINGERS IV SOLN
INTRAVENOUS | Status: DC | PRN
  Administered 2018-08-15 (×2): via INTRAVENOUS

## 2018-08-15 MED ORDER — ONDANSETRON HCL 4 MG/2ML IJ SOLN
INTRAMUSCULAR | Status: AC
  Filled 2018-08-15: qty 2

## 2018-08-15 MED ORDER — IBUPROFEN 800 MG PO TABS: 800.0000 mg | ORAL_TABLET | Freq: Three times a day (TID) | ORAL | 1 refills | Status: DC | PRN

## 2018-08-15 MED ORDER — FENTANYL CITRATE (PF) 100 MCG/2ML IJ SOLN
INTRAMUSCULAR | Status: AC
  Filled 2018-08-15: qty 2

## 2018-08-15 MED ORDER — ROCURONIUM BROMIDE 50 MG/5ML IV SOLN
INTRAVENOUS | Status: AC
  Filled 2018-08-15: qty 1

## 2018-08-15 MED ORDER — HYDROMORPHONE HCL 1 MG/ML IJ SOLN
INTRAMUSCULAR | Status: AC
  Filled 2018-08-15: qty 1

## 2018-08-15 MED ORDER — POTASSIUM CHLORIDE CRYS ER 20 MEQ PO TBCR
40.0000 meq | EXTENDED_RELEASE_TABLET | Freq: Once | ORAL | Status: AC
  Administered 2018-08-15: 40 meq via ORAL
  Filled 2018-08-15: qty 2

## 2018-08-15 MED ORDER — GABAPENTIN 800 MG PO TABS: 800.0000 mg | ORAL_TABLET | Freq: Every day | ORAL | 0 refills | Status: DC

## 2018-08-15 MED ORDER — HYDROMORPHONE HCL 1 MG/ML IJ SOLN: 0.2500 mg | INTRAMUSCULAR | Status: DC | PRN

## 2018-08-15 MED ORDER — MENTHOL 3 MG MT LOZG
1.0000 | LOZENGE | OROMUCOSAL | Status: DC | PRN
  Filled 2018-08-15: qty 9

## 2018-08-15 MED ORDER — FENTANYL CITRATE (PF) 100 MCG/2ML IJ SOLN
INTRAMUSCULAR | Status: DC | PRN
  Administered 2018-08-15 (×2): 50 ug via INTRAVENOUS

## 2018-08-15 MED ORDER — LIDOCAINE HCL (PF) 2 % IJ SOLN
INTRAMUSCULAR | Status: AC
  Filled 2018-08-15: qty 10

## 2018-08-15 MED ORDER — POTASSIUM CHLORIDE 10 MEQ/100ML IV SOLN
10.0000 meq | INTRAVENOUS | Status: DC
  Filled 2018-08-15 (×2): qty 100

## 2018-08-15 MED ORDER — BUPIVACAINE HCL (PF) 0.5 % IJ SOLN
INTRAMUSCULAR | Status: AC
  Filled 2018-08-15: qty 30

## 2018-08-15 MED ORDER — BUPIVACAINE HCL 0.5 % IJ SOLN
INTRAMUSCULAR | Status: DC | PRN
  Administered 2018-08-15: 11 mL

## 2018-08-15 MED ORDER — PHENYLEPHRINE HCL 10 MG/ML IJ SOLN
INTRAMUSCULAR | Status: AC
  Filled 2018-08-15: qty 1

## 2018-08-15 SURGICAL SUPPLY — 43 items
ADH SKN CLS APL DERMABOND .7 (GAUZE/BANDAGES/DRESSINGS) ×2
ANCHOR TIS RET SYS 1550ML (BAG) ×2 IMPLANT
BAG SPEC RTRVL C1550 25.4 (BAG) ×2
BAG SPEC RTRVL LRG 6X4 10 (ENDOMECHANICALS) ×2
BAG URINE DRAINAGE (UROLOGICAL SUPPLIES) ×4 IMPLANT
BLADE SURG SZ11 CARB STEEL (BLADE) ×4 IMPLANT
CATH FOLEY 2WAY  5CC 16FR (CATHETERS)
CATH FOLEY 2WAY 5CC 16FR (CATHETERS)
CATH URTH 16FR FL 2W BLN LF (CATHETERS) ×2 IMPLANT
CHLORAPREP W/TINT 26ML (MISCELLANEOUS) ×4 IMPLANT
CLOSURE WOUND 1/4X4 (GAUZE/BANDAGES/DRESSINGS) ×1
COVER WAND RF STERILE (DRAPES) ×4 IMPLANT
DERMABOND ADVANCED (GAUZE/BANDAGES/DRESSINGS) ×2
DERMABOND ADVANCED .7 DNX12 (GAUZE/BANDAGES/DRESSINGS) ×2 IMPLANT
DRAPE GENERAL ENDO 106X123.5 (DRAPES) ×4 IMPLANT
GLOVE BIO SURGEON STRL SZ7 (GLOVE) ×8 IMPLANT
GLOVE INDICATOR 7.5 STRL GRN (GLOVE) ×4 IMPLANT
GOWN STRL REUS W/ TWL LRG LVL3 (GOWN DISPOSABLE) ×4 IMPLANT
GOWN STRL REUS W/TWL LRG LVL3 (GOWN DISPOSABLE) ×8
IRRIGATION STRYKERFLOW (MISCELLANEOUS) ×2 IMPLANT
IRRIGATOR STRYKERFLOW (MISCELLANEOUS) ×4
IV NS 1000ML (IV SOLUTION) ×4
IV NS 1000ML BAXH (IV SOLUTION) ×2 IMPLANT
KIT PINK PAD W/HEAD ARE REST (MISCELLANEOUS) ×4
KIT PINK PAD W/HEAD ARM REST (MISCELLANEOUS) ×2 IMPLANT
KIT TURNOVER CYSTO (KITS) ×4 IMPLANT
LABEL OR SOLS (LABEL) ×4 IMPLANT
LIGASURE VESSEL 5MM BLUNT TIP (ELECTROSURGICAL) ×2 IMPLANT
NS IRRIG 500ML POUR BTL (IV SOLUTION) ×4 IMPLANT
PACK GYN LAPAROSCOPIC (MISCELLANEOUS) ×4 IMPLANT
PAD OB MATERNITY 4.3X12.25 (PERSONAL CARE ITEMS) ×4 IMPLANT
PAD PREP 24X41 OB/GYN DISP (PERSONAL CARE ITEMS) ×4 IMPLANT
POUCH SPECIMEN RETRIEVAL 10MM (ENDOMECHANICALS) ×2 IMPLANT
SCISSORS METZENBAUM CVD 33 (INSTRUMENTS) ×2 IMPLANT
SLEEVE ENDOPATH XCEL 5M (ENDOMECHANICALS) ×4 IMPLANT
STRIP CLOSURE SKIN 1/4X4 (GAUZE/BANDAGES/DRESSINGS) ×3 IMPLANT
SUT MNCRL AB 4-0 PS2 18 (SUTURE) ×4 IMPLANT
SUT VIC AB 2-0 UR6 27 (SUTURE) ×4 IMPLANT
SUT VIC AB 4-0 SH 27 (SUTURE) ×4
SUT VIC AB 4-0 SH 27XANBCTRL (SUTURE) ×2 IMPLANT
SUT VICRYL 0 AB UR-6 (SUTURE) ×2 IMPLANT
TROCAR XCEL NON-BLD 5MMX100MML (ENDOMECHANICALS) ×4 IMPLANT
TUBING INSUFFLATION (TUBING) ×4 IMPLANT

## 2018-08-15 NOTE — Progress Notes (Signed)
Pt's potassium too low for OR this morning. Will replete and recheck with hope to proceed with safe exploratory laparoscopy later today  PT anemia, significantly. Will recheck labs and replace if sx.

## 2018-08-15 NOTE — Progress Notes (Signed)
Dr. Dalbert GarnetBeasley notified of Sodium 129 and Potassium 2.8 yesterday and no intervention. Verbal orders received- see new orders.

## 2018-08-15 NOTE — Anesthesia Post-op Follow-up Note (Signed)
Anesthesia QCDR form completed.        

## 2018-08-15 NOTE — Anesthesia Postprocedure Evaluation (Signed)
Anesthesia Post Note  Patient: Elizabeth Yu  Procedure(s) Performed: LAPAROSCOPIC OOPHORECTOMY WITH POSSIBLE PELVIC WASHINGS - BILATERAL (Bilateral ) CYSTOSCOPY  Patient location during evaluation: PACU Anesthesia Type: General Level of consciousness: awake and alert and oriented Pain management: pain level controlled Vital Signs Assessment: post-procedure vital signs reviewed and stable Respiratory status: spontaneous breathing Cardiovascular status: blood pressure returned to baseline Anesthetic complications: no     Last Vitals:  Vitals:   08/15/18 1609 08/15/18 1715  BP: 122/70 138/76  Pulse: 76 83  Resp: 18 18  Temp: 36.9 C   SpO2: 100% 98%    Last Pain:  Vitals:   08/15/18 1609  TempSrc: Oral  PainSc:                  [name]

## 2018-08-15 NOTE — Discharge Instructions (Signed)
Laparoscopic Ovarian Surgery Discharge Instructions  For the next three days, take ibuprofen and acetaminophen on a schedule, every 8 hours. You can take them together or you can intersperse them, and take one every four hours. I also gave you gabapentin for nighttime, to help you sleep and also to control pain. Take gabapentin medicines at night for at least the next 3 nights. You also have a narcotic, oxycodone, to take as needed if the above medicines don't help.  Postop constipation is a major cause of pain. Stay well hydrated, walk as you tolerate, and take over the counter senna as well as stool softeners if you need them.   RISKS AND COMPLICATIONS  Infection. Bleeding. Injury to surrounding organs. Anesthetic side effects.   PROCEDURE  You may be given a medicine to help you relax (sedative) before the procedure. You will be given a medicine to make you sleep (general anesthetic) during the procedure. A tube will be put down your throat to help your breath while under general anesthesia. Several small cuts (incisions) are made in the lower abdominal area and one incision is made near the belly button. Your abdominal area will be inflated with a safe gas (carbon dioxide). This helps give the surgeon room to operate, visualize, and helps the surgeon avoid other organs. A thin, lighted tube (laparoscope) with a camera attached is inserted into your abdomen through the incision near the belly button. Other small instruments may also be inserted through other abdominal incisions. The ovary is located and are removed. After the ovary is removed, the gas is released from the abdomen. The incisions will be closed with stitches (sutures), and Dermabond. A bandage may be placed over the incisions.  AFTER THE PROCEDURE  You will also have some mild abdominal discomfort for 3-7 days. You will be given pain medicine to ease any discomfort. As long as there are no problems, you may be allowed to  go home. Someone will need to drive you home and be with you for at least 24 hours once home. You may have some mild discomfort in the throat. This is from the tube placed in your throat while you were sleeping. You may experience discomfort in the shoulder area from some trapped air between the liver and diaphragm. This sensation is normal and will slowly go away on its own.  HOME CARE INSTRUCTIONS  Take all medicines as directed. Only take over-the-counter or prescription medicines for pain, discomfort, or fever as directed by your caregiver. Resume daily activities as directed. Showers are preferred over baths for 2 weeks. You may resume sexual activities in 1 week or as you feel you would like to. Do not drive while taking narcotics.  SEEK MEDICAL CARE IF: . There is increasing abdominal pain. You feel lightheaded or faint. You have the chills. You have an oral temperature above 102 F (38.9 C). There is pus-like (purulent) drainage from any of the wounds. You are unable to pass gas or have a bowel movement. You feel sick to your stomach (nauseous) or throw up (vomit) and can't control it with your medicines.  MAKE SURE YOU:  Understand these instructions. Will watch your condition. Will get help right away if you are not doing well or get worse.  [employer] Patient Information 2013 [employer]      

## 2018-08-15 NOTE — Op Note (Signed)
Elizabeth Yu PROCEDURE DATE: 08/14/2018 - 08/15/2018  PREOPERATIVE DIAGNOSIS: Acute pelvic pain, right ovarian cyst POSTOPERATIVE DIAGNOSIS: Acute pelvic pain, right ovarian torsion, large right ovarian cyst PROCEDURE: Operative laparoscopy, pelvic washings, peritoneal biopsies of sigmoid colon, bilateral salpingo-oophorectomy SURGEON:  Dr. Christeen DouglasBethany Jarmal Lewelling ASSISTANT: Dr. Leeroy Bockhelsea Ward ANESTHESIOLOGIST: Yves Dillarroll, Paul, MD Anesthesiologist: Yves Dillarroll, Paul, MD; Jovita GammaFitzgerald, Kathryn L, MD CRNA: Irving BurtonBachich, Jennifer, CRNA; Disser, Anne NgAndrew L, CRNA  INDICATIONS: 62 y.o. postmenopausal female with history of acute pelvic pain x4 days with large right ovarian cyst with features concerning for malignancy versus ovarian torsion desiring surgical evaluation.   Please see preoperative notes for further details.  Risks of surgery were discussed with the patient including but not limited to: bleeding which may require transfusion or reoperation; infection which may require antibiotics; injury to bowel, bladder, ureters or other surrounding organs; need for additional procedures including laparotomy; thromboembolic phenomenon, incisional problems and other postoperative/anesthesia complications. Written informed consent was obtained.    FINDINGS:  Absent uterus, normal left ovary and fallopian tube.  Large hemorrhagic cyst on the right that extended almost to the umbilicus, and had ruptured prior to our entry.  The pelvic cavity was filled with hemoperitoneum.  There was evident necrotic tissue in the right ovary from a double right IP torsion at the level of the pelvic inlet. Peritoneal washings were taken and sent to pathology.  There was a small nodule just below the right adnexa on epiploica of the sigmoid colon.  No other abdominal/pelvic abnormality.  Normal upper abdomen.  Normal-appearing omentum.  There was no peritoneal studding or omental caking noted.  ANESTHESIA:    General INTRAVENOUS FLUIDS: 1000  ml ESTIMATED BLOOD LOSS: minimalml URINE OUTPUT: 150 ml HEMOPERITONEUM: 200 mL evacuated, with significant amount of fluid remaining in the pelvis after surgery. SPECIMENS: Bilateral ovary and tube, pelvic washings, epiploica biopsy COMPLICATIONS: None immediate  PROCEDURE IN DETAIL:  The patient had sequential compression devices applied to her lower extremities while in the preoperative area.  She was then taken to the operating room where general anesthesia was administered and was found to be adequate.  She was placed in the dorsal lithotomy position, and was prepped and draped in a sterile manner.  A Foley catheter was inserted into her bladder and attached to constant drainage and a sponge stick was then advanced to the vaginal cuff .  After an adequate timeout was performed, attention was turned to the abdomen where an umbilical incision was made with the scalpel.  The Optiview 5-mm trocar and sleeve were then advanced without difficulty with the laparoscope under direct visualization into the abdomen.  The abdomen was then insufflated with carbon dioxide gas and adequate pneumoperitoneum was obtained.   Just below the umbilicus was a large ovarian cyst, that obscured visualization.  For this reason, a 5 mm port was placed in the left upper quadrant under direct visualization.  A detailed survey of the patient's pelvis and abdomen revealed the findings as mentioned above.  An additional 5mm trocar was placed in the right lower quadrant under direct visualization, and at 10mm trocar was placed in the opposite quadrant under direct visualization.   Evaluation of the pelvic sidewalls to observe the course of the ureters was undertaken, but due to the amount of bowel adhesions in the pelvis and fluid, the ureters were unable to be visualized.    Pelvic washings were taken.    Right and left oopherectomy The normal appearing left adnexa was elevated away from the pelvic  sidewall. Using a Ligasure  electrocautery device, the IP ligament was skeletonized, triply clamped, cauterized and transected to remove the ovary and fallopian tube in its entirety, placed in a bag, and removed intact. Lysis of adhesions between the bowel and the right adnexa were undertaken, carefully.  These adhesions appeared to be reactive in nature, and were easily removed with blunt dissection.  The IP ligament on the right side was also skeletonized, triply clamped, cauterized and transected to remove the ovary and fallopian tube entirely.  This large adnexa was placed into a large bag, and removed through an enlarged left lower quadrant port, being careful not to spill the contents of the ovarian cyst.  However, of course, the cyst had ruptured prior to our entry into the pelvis.  The operative site was surveyed, and it was found to be hemostatic.  No intraoperative injury to surrounding organs was noted. The fascia of the 10 mm trocar site was closed with 0 Vicryl.   Pictures were taken of the quadrants and pelvis. The abdomen was desufflated and all instruments were then removed from the patient's abdomen. The vaginal manipulator was removed without complications.  All incisions were closed with 4-0 Vicryl and Dermabond.   CYSTOCOPY PARAGRAPH The Foley catheter was removed and an uncomplicated cystoscopy was performed. 800 mL of fluid was instilled for hydro-distention, drained, and the bladder mucosa was visualized again with the above findings. Excellent efflux was noted from both ureteral orifices.  The patient tolerated the procedures well.  All instruments, needles, and sponge counts were correct x 2. The patient was taken to the recovery room in stable condition.

## 2018-08-15 NOTE — Anesthesia Preprocedure Evaluation (Addendum)
Anesthesia Evaluation  Patient identified by MRN, date of birth, ID band Patient awake    Reviewed: Allergy & Precautions, H&P , NPO status , Patient's Chart, lab work & pertinent test results  Airway Mallampati: III       Dental  (+) Missing, Chipped, Poor Dentition   Pulmonary sleep apnea and Continuous Positive Airway Pressure Ventilation , neg COPD, neg recent URI, former smoker,           Cardiovascular hypertension, (-) angina(-) Past MI and (-) Cardiac Stents negative cardio ROS  (-) dysrhythmias      Neuro/Psych PSYCHIATRIC DISORDERS Anxiety Depression negative neurological ROS     GI/Hepatic Neg liver ROS, GERD  ,  Endo/Other  Morbid obesity  Renal/GU      Musculoskeletal  (+) Arthritis ,   Abdominal   Peds  Hematology  (+) Blood dyscrasia, anemia ,   Anesthesia Other Findings Pt hypokalemic (K 2.8) and anemic (Hgb 7.6) as of yesterday's labs, both values were downtrending, will re-check stat prior to surgery.  Past Medical History: No date: Anemia No date: Anxiety No date: Arthritis No date: Depression No date: Edema     Comment:  ANKLES OCCAS No date: GERD (gastroesophageal reflux disease) No date: Hypertension No date: Sleep apnea     Comment:  CPAP  Past Surgical History: No date: ABDOMINAL HYSTERECTOMY 10/18/2017: CATARACT EXTRACTION W/PHACO; Left     Comment:  Procedure: CATARACT EXTRACTION PHACO AND INTRAOCULAR               LENS PLACEMENT (IOC);  Surgeon: Nevada CraneKing, Bradley Mark, MD;                Location: [facility] ORS;  Service: Ophthalmology;  Laterality:              Left;  Lot #[id number] H US: 00:19.1 AP%: 7.3 CDE: 1.38 11/15/2017: CATARACT EXTRACTION W/PHACO; Right     Comment:  Procedure: CATARACT EXTRACTION PHACO AND INTRAOCULAR               LENS PLACEMENT (IOC);  Surgeon: Nevada CraneKing, Bradley Mark, MD;                Location: [facility];  Service: Ophthalmology;  Laterality:  Right;  Lot # [id number] H US: 00:09.9 AP%: 7.0 CDE: 0.69 02/12/2015: ESOPHAGOGASTRODUODENOSCOPY; N/A     Comment:  Procedure: ESOPHAGOGASTRODUODENOSCOPY (EGD);  Surgeon:               Scot Junobert T Elliott, MD;  [facility];                Service: Endoscopy;  Laterality: N/A; No date: FRACTURE SURGERY; Right     Comment:  PLATE AND 13 SCREWS No date: HERNIA REPAIR 12/12/2012: ORIF WRIST FRACTURE; Right     Comment:  Procedure: OPEN REDUCTION INTERNAL FIXATION (ORIF) WRIST              FRACTURE Right ;  Surgeon: Johnette AbrahamHarrill C Coley, MD;                Location: [facility] OR;  Service: Plastics;  Laterality: Right; No date: ROTATOR CUFF REPAIR No date: TONSILLECTOMY No date: TUBAL LIGATION  BMI    Body Mass Index:  38.55 kg/m      Reproductive/Obstetrics negative OB ROS                            Anesthesia Physical Anesthesia Plan  ASA: III  Anesthesia Plan: General ETT   Post-op Pain Management:    Induction:   PONV Risk Score and Plan: Ondansetron, Dexamethasone, Midazolam and Treatment may vary due to age or medical condition  Airway Management Planned:   Additional Equipment:   Intra-op Plan:   Post-operative Plan:   Informed Consent: I have reviewed the patients History and Physical, chart, labs and discussed the procedure including the risks, benefits and alternatives for the proposed anesthesia with the patient or authorized representative who has indicated his/her understanding and acceptance.   Dental Advisory Given  Plan Discussed with: Anesthesiologist, CRNA and Surgeon  Anesthesia Plan Comments:         Anesthesia Quick Evaluation

## 2018-08-15 NOTE — Anesthesia Procedure Notes (Signed)
Procedure Name: Intubation Date/Time: 08/15/2018 12:05 PM Performed by: Chanetta Marshall, CRNA Pre-anesthesia Checklist: Patient identified, Emergency Drugs available, Suction available and Patient being monitored Patient Re-evaluated:Patient Re-evaluated prior to induction Oxygen Delivery Method: Circle system utilized Induction Type: Combination inhalational/ intravenous induction Ventilation: Oral airway inserted - appropriate to patient size and Mask ventilation without difficulty Laryngoscope Size: Mac and 3 Grade View: Grade I Tube type: Oral Tube size: 7.5 mm Number of attempts: 1 Airway Equipment and Method: Stylet Placement Confirmation: ETT inserted through vocal cords under direct vision,  positive ETCO2,  CO2 detector and breath sounds checked- equal and bilateral Secured at: 21 cm Tube secured with: Tape Dental Injury: Teeth and Oropharynx as per pre-operative assessment

## 2018-08-15 NOTE — Transfer of Care (Signed)
Immediate Anesthesia Transfer of Care Note  Patient: Elizabeth Yu  Procedure(s) Performed: LAPAROSCOPIC OOPHORECTOMY WITH POSSIBLE PELVIC WASHINGS - BILATERAL (Bilateral ) CYSTOSCOPY  Patient Location: PACU  Anesthesia Type:General  Level of Consciousness: awake, alert  and oriented  Airway & Oxygen Therapy: Patient Spontanous Breathing and Patient connected to face mask oxygen  Post-op Assessment: Report given to RN and Post -op Vital signs reviewed and stable  Post vital signs: Reviewed and stable  Last Vitals:  Vitals Value Taken Time  BP    Temp    Pulse    Resp    SpO2      Last Pain:  Vitals:   08/15/18 0940  TempSrc: Tympanic  PainSc: 0-No pain         Complications: No apparent anesthesia complications

## 2018-08-15 NOTE — Interval H&P Note (Signed)
History and Physical Interval Note:  08/15/2018 10:46 AM  Elizabeth Yu  has presented today for surgery, with the diagnosis of Ovarian issue  The various methods of treatment have been discussed with the patient and family. After consideration of risks, benefits and other options for treatment, the patient has consented to  Procedure(s): LAPAROSCOPIC OOPHORECTOMY WITH POSSIBLE PELVIC WASHINGS - BILATERAL (Bilateral) as a surgical intervention .  The patient's history has been reviewed, patient examined, no change in status, stable for surgery.  I have reviewed the patient's chart and labs.  Questions were answered to the patient's satisfaction.     Christeen DouglasBethany Tisha Cline

## 2018-08-15 NOTE — Progress Notes (Signed)
Day of Surgery Procedure(s): LAPAROSCOPIC OOPHORECTOMY WITH POSSIBLE PELVIC WASHINGS - BILATERAL (Bilateral) CYSTOSCOPY Subjective: Pain is adequately controlled. No SOB or CP. Resting quietly. Regular diet.  Objective: Vital signs in last 24 hours: Temp:  [97.4 F (36.3 C)-98.4 F (36.9 C)] 98 F (36.7 C) (12/19 1715) Pulse Rate:  [64-91] 83 (12/19 1715) Resp:  [11-20] 18 (12/19 1715) BP: (103-147)/(51-82) 138/76 (12/19 1715) SpO2:  [87 %-100 %] 98 % (12/19 1715) Weight:  [92.5 kg] 92.5 kg (12/19 0940)  Intake/Output  Intake/Output Summary (Last 24 hours) at 08/15/2018 1944 Last data filed at 08/15/2018 1700 Gross per 24 hour  Intake 2696.07 ml  Output 2210 ml  Net 486.07 ml    Physical Exam:  General: Alert and oriented. CV: RRR Lungs: Clear bilaterally. GI: Soft, Nondistended. Incisions: Clean and dry. Urine: Clear Extremities: Nontender, no erythema, no edema.  Assessment/Plan: POD# 0 s/p lap BSO.  1) Encourage Incentive spirometry 2) Advance diet as tolerated, but may do clears overnight 3) Continue oral pain medication, with IV rescue as needed 4) OOB to chair for 4 hours this afternoon   Elizabeth DouglasBethany Markevion Lattin, MD   LOS: 1 day   Elizabeth Yu 08/15/2018, 7:44 PM

## 2018-08-16 ENCOUNTER — Encounter: Payer: Self-pay | Admitting: Obstetrics and Gynecology

## 2018-08-16 DIAGNOSIS — N83511 Torsion of right ovary and ovarian pedicle: Secondary | ICD-10-CM | POA: Diagnosis not present

## 2018-08-16 LAB — BASIC METABOLIC PANEL
ANION GAP: 10 (ref 5–15)
BUN: 10 mg/dL (ref 8–23)
CALCIUM: 8 mg/dL — AB (ref 8.9–10.3)
CO2: 25 mmol/L (ref 22–32)
Chloride: 97 mmol/L — ABNORMAL LOW (ref 98–111)
Creatinine, Ser: 0.96 mg/dL (ref 0.44–1.00)
GFR calc Af Amer: >60 mL/min (ref >60)
GFR calc non Af Amer: >60 mL/min (ref >60)
Glucose, Bld: 140 mg/dL — ABNORMAL HIGH (ref 70–99)
Potassium: 3.6 mmol/L (ref 3.5–5.1)
Sodium: 132 mmol/L — ABNORMAL LOW (ref 135–145)

## 2018-08-16 LAB — CBC
HCT: 24 % — ABNORMAL LOW (ref 36.0–46.0)
Hemoglobin: 6.9 g/dL — ABNORMAL LOW (ref 12.0–15.0)
MCH: 20.8 pg — ABNORMAL LOW (ref 26.0–34.0)
MCHC: 28.8 g/dL — ABNORMAL LOW (ref 30.0–36.0)
MCV: 72.5 fL — ABNORMAL LOW (ref 80.0–100.0)
NRBC: 0 % (ref 0.0–0.2)
Platelets: 400 10*3/uL (ref 150–400)
RBC: 3.31 MIL/uL — ABNORMAL LOW (ref 3.87–5.11)
RDW: 18 % — AB (ref 11.5–15.5)
WBC: 11.5 10*3/uL — ABNORMAL HIGH (ref 4.0–10.5)

## 2018-08-16 NOTE — Discharge Summary (Signed)
1 Day Post-Op       Procedure(s): LAPAROSCOPIC OOPHORECTOMY WITH POSSIBLE PELVIC WASHINGS - BILATERAL (Bilateral) CYSTOSCOPY Subjective: The patient is doing well.  No nausea or vomiting. Pain is adequately controlled.  Objective: Vital signs in last 24 hours: Temp:  [97.8 F (36.6 C)-98.1 F (36.7 C)] 97.8 F (36.6 C) (12/20 1624) Pulse Rate:  [66-89] 66 (12/20 1624) Resp:  [18-19] 19 (12/20 1624) BP: (99-123)/(65-72) 99/65 (12/20 1624) SpO2:  [98 %-100 %] 100 % (12/20 1624)  Intake/Output  Intake/Output Summary (Last 24 hours) at 08/16/2018 1802 Last data filed at 08/16/2018 1434 Gross per 24 hour  Intake 720 ml  Output -  Net 720 ml    Physical Exam:  General: Alert and oriented. CV: RRR Lungs: Clear bilaterally. GI: Soft, Nondistended. Incisions: Clean and dry. Urine: Clear, Foley in place Extremities: Nontender, no erythema, no edema.  Lab Results: Recent Labs    08/14/18 1158 08/15/18 0819 08/16/18 0547  HGB 7.6* 8.4* 6.9*  HCT 25.5* 28.7* 24.0*  WBC 15.9* 10.8* 11.5*  PLT 329 406* 400                 Results for orders placed or performed during the hospital encounter of 08/14/18 (from the past 24 hour(s))  CBC     Status: Abnormal   Collection Time: 08/16/18  5:47 AM  Result Value Ref Range   WBC 11.5 (H) 4.0 - 10.5 K/uL   RBC 3.31 (L) 3.87 - 5.11 MIL/uL   Hemoglobin 6.9 (L) 12.0 - 15.0 g/dL   HCT 04.524.0 (L) 40.936.0 - 81.146.0 %   MCV 72.5 (L) 80.0 - 100.0 fL   MCH 20.8 (L) 26.0 - 34.0 pg   MCHC 28.8 (L) 30.0 - 36.0 g/dL   RDW 91.418.0 (H) 78.211.5 - 95.615.5 %   Platelets 400 150 - 400 K/uL   nRBC 0.0 0.0 - 0.2 %  Basic metabolic panel     Status: Abnormal   Collection Time: 08/16/18  5:47 AM  Result Value Ref Range   Sodium 132 (L) 135 - 145 mmol/L   Potassium 3.6 3.5 - 5.1 mmol/L   Chloride 97 (L) 98 - 111 mmol/L   CO2 25 22 - 32 mmol/L   Glucose, Bld 140 (H) 70 - 99 mg/dL   BUN 10 8 - 23 mg/dL   Creatinine, Ser 2.130.96 0.44 - 1.00 mg/dL   Calcium 8.0 (L)  8.9 - 10.3 mg/dL   GFR calc non Af Amer >60 >60 mL/min   GFR calc Af Amer >60 >60 mL/min   Anion gap 10 5 - 15    Assessment/Plan: 1 Day Post-Op       Procedure(s): LAPAROSCOPIC OOPHORECTOMY WITH POSSIBLE PELVIC WASHINGS - BILATERAL (Bilateral) CYSTOSCOPY  1) Ambulate, Incentive spirometry 2) Advance diet as tolerated 3) Transition to oral pain medication 4) D/C Foley 5) Discharge home today anticipated    Christeen DouglasBethany Lucretia Pendley, MD   LOS: 1 day   Christeen DouglasBethany Calisha Tindel 08/16/2018, 6:02 PM

## 2018-08-16 NOTE — Progress Notes (Signed)
Pt discharged home.  Discharge instructions, prescriptions and follow up appointment given to and reviewed with pt.  Pt verbalized understanding.  Escorted by auxillary. 

## 2018-08-19 LAB — SURGICAL PATHOLOGY

## 2018-08-19 LAB — CYTOLOGY - NON PAP

## 2019-06-12 IMAGING — CT CT ABD-PELV W/ CM
2 of 5 series · 16 of 46 positions shown, 18 images · IV contrast (APPLIED)
Comparison: Abdomen and pelvis CT report 12/12/2012, images not
available

CLINICAL DATA: Abdominal pain with vomiting last night.

EXAM:
CT ABDOMEN AND PELVIS WITH CONTRAST
TECHNIQUE: Multidetector CT imaging of the abdomen and pelvis was performed
using the standard protocol following bolus administration of
intravenous contrast.
CONTRAST:  100mL [id number] IOPAMIDOL ([id number]) INJECTION 61%

[Series 2: routine abd/pel with · axial · 0.77mm/px · z∈[-375,+25]mm · 13 of 92 slices shown, 15 images]
[im 6/92  soft-tissue]
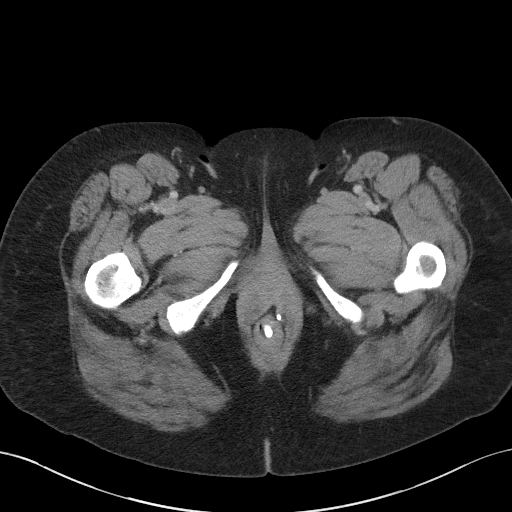
[im 6/92  bone]
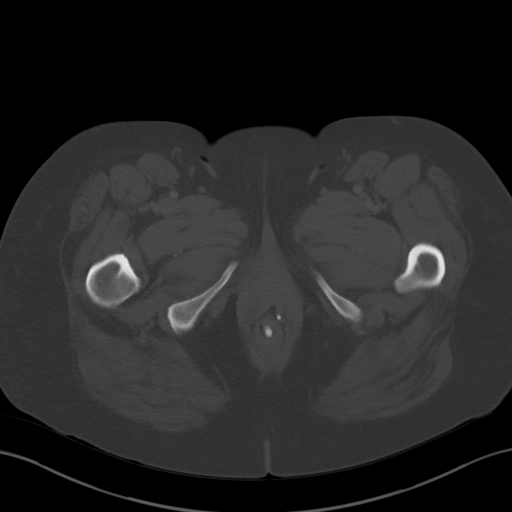
[im 11/92  soft-tissue]
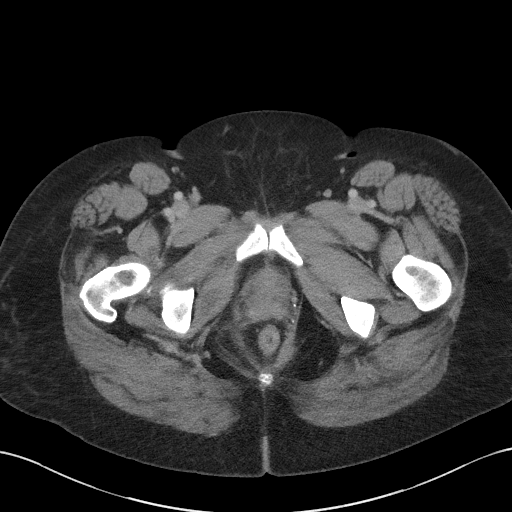
[im 22/92  soft-tissue]
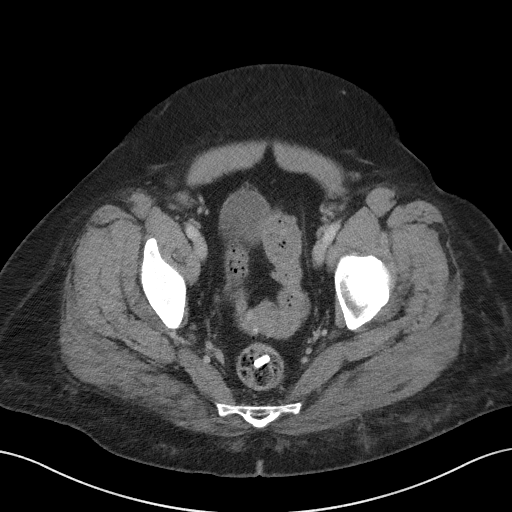
[im 27/92  soft-tissue]
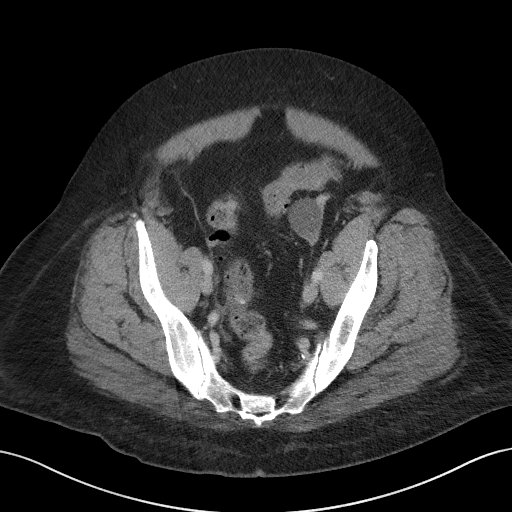
[im 33/92  soft-tissue]
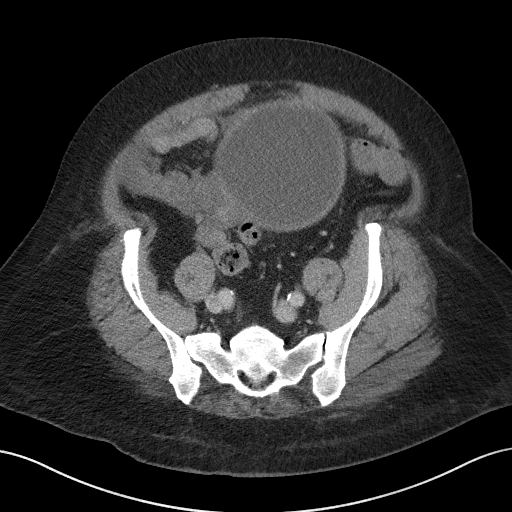
[im 38/92  soft-tissue]
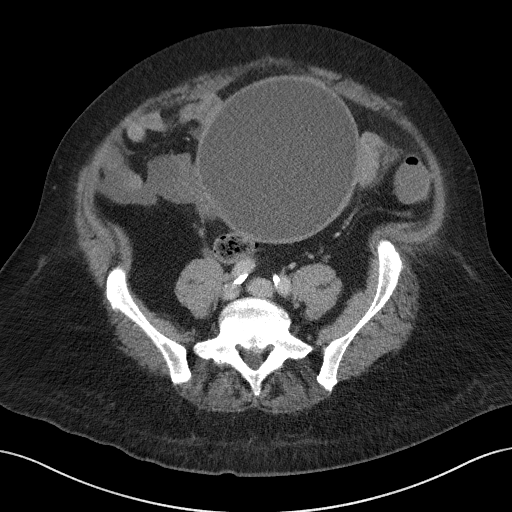
[im 49/92  soft-tissue]
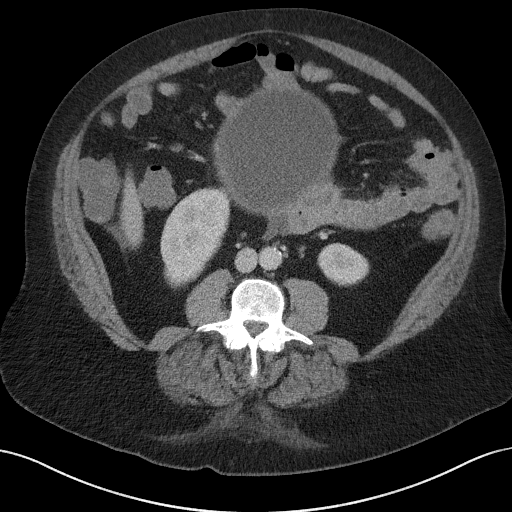
[im 54/92  soft-tissue]
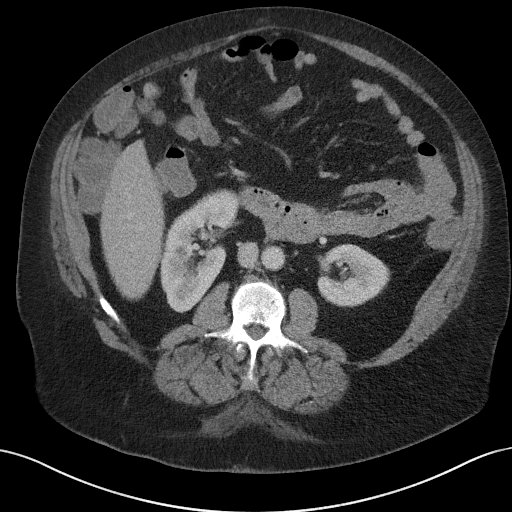
[im 59/92  soft-tissue]
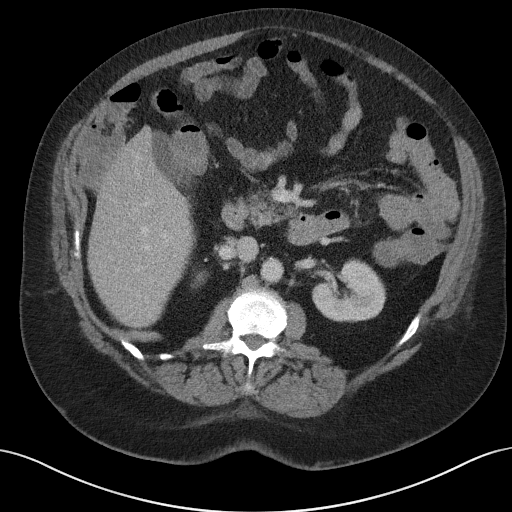
[im 59/92  bone]
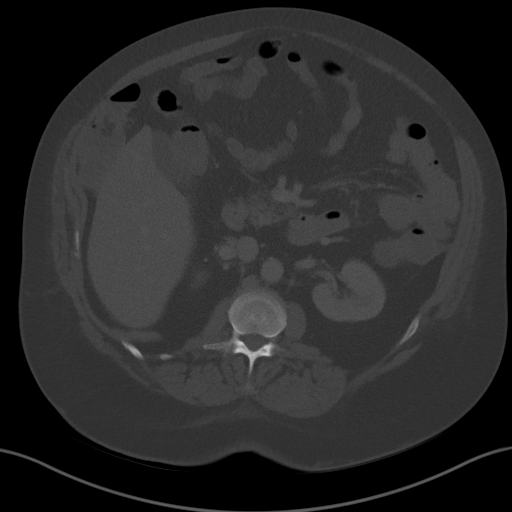
[im 65/92  soft-tissue]
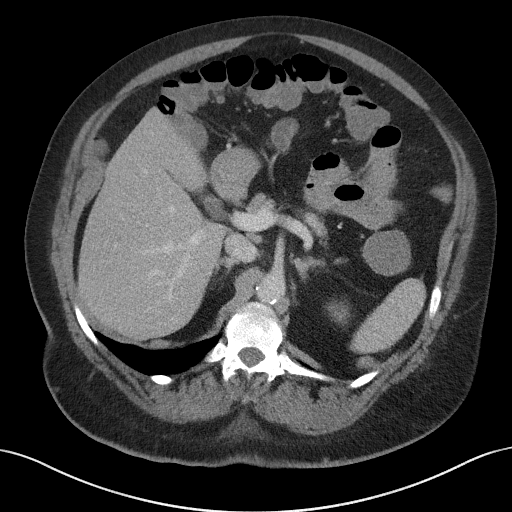
[im 70/92  soft-tissue]
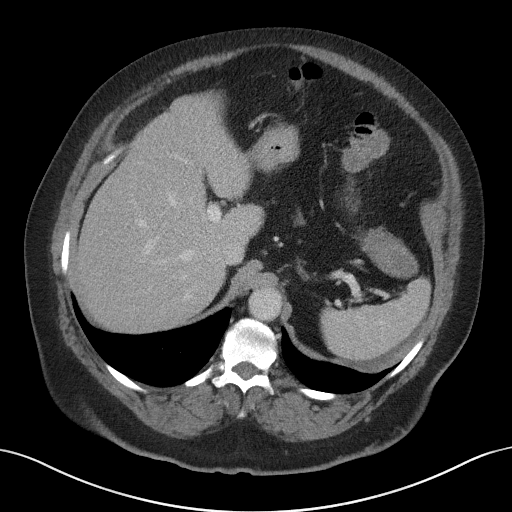
[im 81/92  soft-tissue]
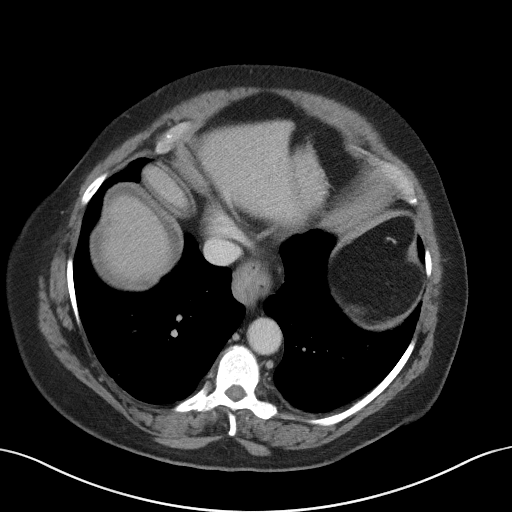
[im 86/92  soft-tissue]
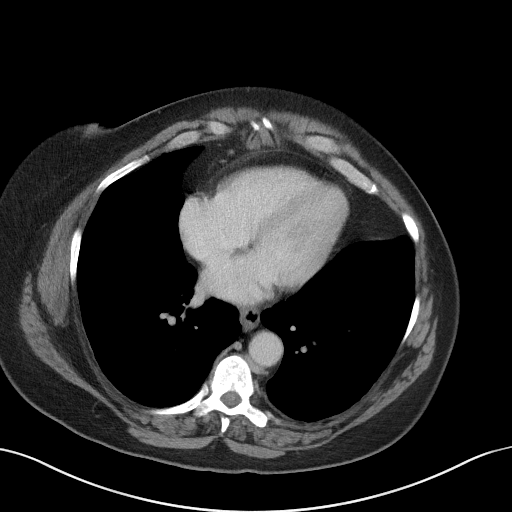

[Series 5: coronal st · coronal · 0.79mm/px · 3 of 104 slices shown]
[im 35/104  soft-tissue]
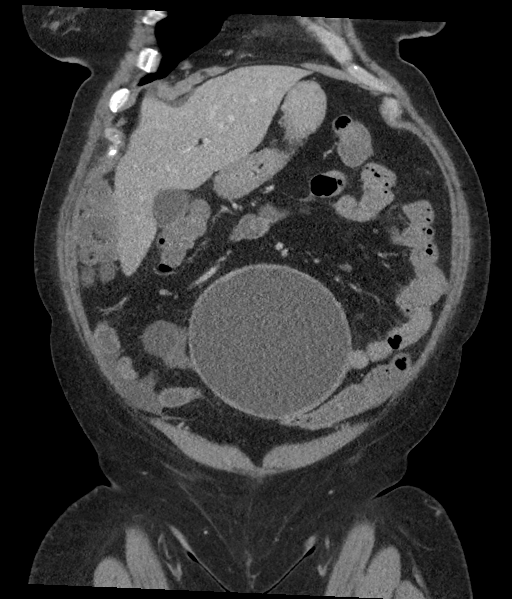
[im 46/104  soft-tissue]
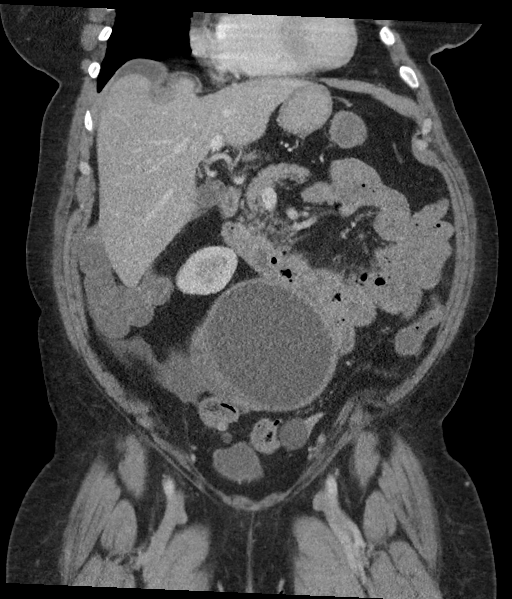
[im 58/104  soft-tissue]
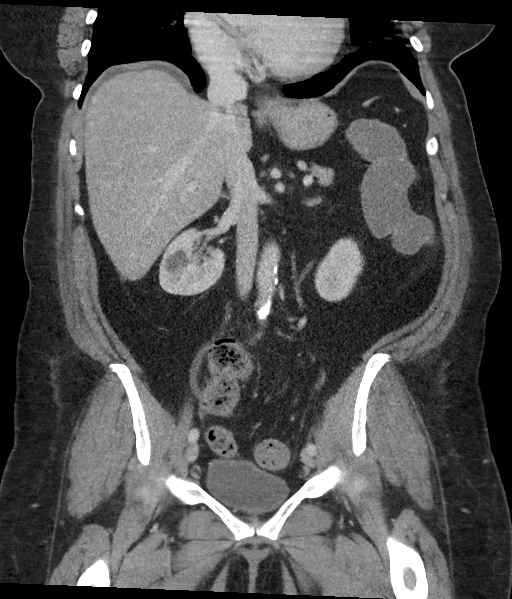

[16 of 46 positions shown; findings below may reference images not displayed]

FINDINGS: Lower chest:  Mild scarring at the lingula.

Small sliding hiatal hernia.  Suspect prior Nissen fundoplication.

Hepatobiliary: No focal liver abnormality.No evidence of biliary
obstruction or stone.

Pancreas: Unremarkable.

Spleen: Unremarkable.

Adrenals/Urinary Tract: Negative adrenals. No hydronephrosis or
stone. Small right renal cystic density. Unremarkable bladder.

Stomach/Bowel:  No obstruction. No appendicitis.

Vascular/Lymphatic: No acute vascular abnormality. Atherosclerotic
calcification. No mass or adenopathy.

Reproductive:Cystic mass in the pelvis and lower abdomen measuring
13 cm with thick but smooth wall. This is intimately associated with
the right ovary, which also contains a more lateral 3.3 cm cyst. The
ovarian pedicle is distorted and thickened. 2.7 cm left ovarian
cyst, simple appearing.

Other: Trace ascites seen about the liver, spleen, in the pelvis. No
superimposed peritoneal nodularity is noted.

Musculoskeletal: Lower lumbar facet degeneration with L3-4
anterolisthesis. There is a transitional L5 vertebra based on the
lowest ribs.

Critical Value/emergent results were called by telephone at the time
of interpretation on 08/12/2018 at [DATE] to Dr. [HOSPITAL] DECAMPONG
, who verbally acknowledged these results.
IMPRESSION: 1. 13 cm thick walled central abdominal cyst likely arising from the
thickened right ovary. Given location and thickening of the vascular
pedicle recommend sonography to evaluate for torsion.
2. Small volume ascites that could be acute and reactive. There is
no superimposed nodularity, but need gynecology follow-up as a cyst
of this size in the ovary is concerning for epithelial neoplasm.

## 2021-04-05 DIAGNOSIS — Z96641 Presence of right artificial hip joint: Secondary | ICD-10-CM | POA: Insufficient documentation

## 2023-04-06 NOTE — Progress Notes (Unsigned)
Sleep Medicine   Office Visit  Patient Name: Elizabeth Yu DOB: 1956-05-26 MRN [id number]    Chief Complaint: ***  Brief History:  Elizabeth Yu presents for an initial consult for sleep evaluation and to establish care. The patient has a 6 year history of sleep apnea and is currently on a CPAP. Prior to using a PAP, sleep quality was poor due to being tired all the time. This was noted all nights. The patient reported the following symptoms:  snoring, waking tired, EDS, lack of energy . The patient goes to sleep at 12:00-3:00am  and wakes up at 10:00am-11:00am. The patient reports a history of psychiatric problems (anxiety) The Epworth Sleepiness Score is 5 out of 24 . The patient relates  Cardiovascular risk factors include: hypertension. The patient is currently on a APAP@ 12-18 cmH2O. The patient reports benefit from her PAP and feels rested after sleeping with PAP.  The patient reports feeling rested  from PAP use and would like for her to continue using PAP. Reported sleepiness is  improve. The compliance download shows  99% compliance with an average use time of 8 hours 20  minutes. The AHI is 0.6.  The patient continues to require PAP therapy as a medical necessity in order to eliminate her sleep apnea.Patient reports some oral dryness at the beginning of summer, but this has since resolved. Patient currently has a contact rash on her face from her CPAP mask. Advising a cover for the cushion and washing her supplies in a mild hand or face soap.    ROS  General: (-) fever, (-) chills, (-) night sweat Nose and Sinuses: (-) nasal stuffiness or itchiness, (-) postnasal drip, (-) nosebleeds, (-) sinus trouble. Mouth and Throat: (-) sore throat, (-) hoarseness. Neck: (-) swollen glands, (-) enlarged thyroid, (-) neck pain. Respiratory: - cough, - shortness of breath, - wheezing. Neurologic: - numbness, - tingling. Psychiatric: + anxiety, - depression Sleep behavior: -sleep paralysis -hypnogogic  hallucinations -dream enactment      -vivid dreams -cataplexy -night terrors -sleep walking   Current Medication: Outpatient Encounter Medications as of 04/09/2023  Medication Sig   citalopram (CELEXA) 20 MG tablet Take 20 mg by mouth daily.   levocetirizine (XYZAL) 5 MG tablet SMARTSIG:1 Tablet(s) By Mouth Every Evening   omeprazole (PRILOSEC) 20 MG capsule Take 20 mg by mouth daily.   triamterene-hydrochlorothiazide (DYAZIDE) 37.5-25 MG capsule Take 1 capsule by mouth daily.   [DISCONTINUED] esomeprazole (NEXIUM) 20 MG capsule Take 20 mg by mouth 2 (two) times daily before a meal. Breakfast & supper   [DISCONTINUED] estradiol (ESTRACE) 1 MG tablet Take 1 mg by mouth daily.   [DISCONTINUED] fexofenadine (ALLEGRA) 180 MG tablet Take 180 mg by mouth 2 (two) times daily.   [DISCONTINUED] gabapentin (NEURONTIN) 800 MG tablet Take 1 tablet (800 mg total) by mouth at bedtime for 14 days. Take nightly for 3 days, then up to 14 days as needed   [DISCONTINUED] HYDROcodone-acetaminophen (NORCO) 10-325 MG tablet Take 1 tablet by mouth every 6 (six) hours as needed.   [DISCONTINUED] ibuprofen (ADVIL,MOTRIN) 800 MG tablet Take 1 tablet (800 mg total) by mouth every 8 (eight) hours as needed for moderate pain.   [DISCONTINUED] NON FORMULARY Apply 1 drop to eye See admin instructions. Pred-Gati-Brom (Prednisolone Acetate/Gatifloxacin/Bromfenac) 1 drop into left eye twice daily  Then after procedure patient will use into right eye 4x's daily x 1 week, then twice daily until bottle is finished.   [DISCONTINUED] ondansetron (ZOFRAN ODT) 4 MG  disintegrating tablet Take 1 tablet (4 mg total) by mouth every 8 (eight) hours as needed.   No facility-administered encounter medications on file as of 04/09/2023.    Surgical History: Past Surgical History:  Procedure Laterality Date   ABDOMINAL HYSTERECTOMY     CATARACT EXTRACTION W/PHACO Left 10/18/2017   Procedure: CATARACT EXTRACTION PHACO AND INTRAOCULAR LENS  PLACEMENT (IOC);  Surgeon: Nevada Crane, MD;  Location: [facility] ORS;  Service: Ophthalmology;  Laterality: Left;  Lot #[id number] H [place]: 00:19.1 AP%: 7.3 CDE: 1.38   CATARACT EXTRACTION W/PHACO Right 11/15/2017   Procedure: CATARACT EXTRACTION PHACO AND INTRAOCULAR LENS PLACEMENT (IOC);  Surgeon: Nevada Crane, MD;  Location: [facility] ORS;  Service: Ophthalmology;  Laterality: Right;  Lot # [id number] H [place]: 00:09.9 AP%: 7.0 CDE: 0.69   CYSTOSCOPY  08/15/2018   Procedure: CYSTOSCOPY;  Surgeon: Christeen Douglas, MD;  Location: [facility] ORS;  Service: Gynecology;;   ESOPHAGOGASTRODUODENOSCOPY N/A 02/12/2015   Procedure: ESOPHAGOGASTRODUODENOSCOPY (EGD);  Surgeon: Scot Jun, MD;  Location: [facility] ENDOSCOPY;  Service: Endoscopy;  Laterality: N/A;   FRACTURE SURGERY Right    PLATE AND 13 SCREWS   HERNIA REPAIR     ORIF WRIST FRACTURE Right 12/12/2012   Procedure: OPEN REDUCTION INTERNAL FIXATION (ORIF) WRIST FRACTURE Right ;  Surgeon: Johnette Abraham, MD;  Location: [facility] OR;  Service: Plastics;  Laterality: Right;   ROTATOR CUFF REPAIR     TONSILLECTOMY     TUBAL LIGATION      Medical History: Past Medical History:  Diagnosis Date   Anemia    Anxiety    Arthritis    Depression    Edema    ANKLES OCCAS   GERD (gastroesophageal reflux disease)    Hypertension    Sleep apnea    CPAP    Family History: Non contributory to the present illness  Social History: Social History   Socioeconomic History   Marital status: Divorced    Spouse name: Not on file   Number of children: Not on file   Years of education: Not on file   Highest education level: Not on file  Occupational History   Not on file  Tobacco Use   Smoking status: Former    Current packs/day: 0.00    Average packs/day: 1 pack/day for 30.0 years (30.0 ttl pk-yrs)    Types: Cigarettes    Start date: 07/1972    Quit date: 07/2002    Years since quitting: 20.7   Smokeless tobacco: Never  Vaping Use   Vaping status: Never  Used  Substance and Sexual Activity   Alcohol use: Yes    Comment: occasional   Drug use: No   Sexual activity: Yes  Other Topics Concern   Not on file  Social History Narrative   Not on file   Social Determinants of Health   Financial Resource Strain: Not on file  Food Insecurity: Not on file  Transportation Needs: Not on file  Physical Activity: Inactive (08/14/2018)   Exercise Vital Sign    Days of Exercise per Week: 0 days    Minutes of Exercise per Session: 0 min  Stress: Stress Concern Present (08/14/2018)   [employer] of Occupational Health - Occupational Stress Questionnaire    Feeling of Stress : Rather much  Social Connections: Not on file  Intimate Partner Violence: Not on file    Vital Signs: Blood pressure (!) 150/89, pulse 62, resp. rate 12, height 5\' 1"  (1.549 m), weight 208 lb 8 oz (94.6 kg),  SpO2 98%. Body mass index is 39.4 kg/m.   Examination: General Appearance: The patient is well-developed, well-nourished, and in no distress. Neck Circumference: 43.5cm Skin: Gross inspection of skin unremarkable. Head: normocephalic, no gross deformities. Eyes: no gross deformities noted. ENT: ears appear grossly normal Neurologic: Alert and oriented. No involuntary movements.    STOP BANG RISK ASSESSMENT S (snore) Have you been told that you snore?     No   T (tired) Are you often tired, fatigued, or sleepy during the day?   NO  O (obstruction) Do you stop breathing, choke, or gasp during sleep? NO   P (pressure) Do you have or are you being treated for high blood pressure? YES   B (BMI) Is your body index greater than 35 kg/m? YES   A (age) Are you 33 years old or older? YES   N (neck) Do you have a neck circumference greater than 16 inches?   YES   G (gender) Are you a female? NO   TOTAL STOP/BANG "YES" ANSWERS 3                                                               A STOP-Bang score of 2 or less is considered low risk, and a score  of 5 or more is high risk for having either moderate or severe OSA. For people who score 3 or 4, doctors may need to perform further assessment to determine how likely they are to have OSA.         EPWORTH SLEEPINESS SCALE:  Scale:  (0)= no chance of dozing; (1)= slight chance of dozing; (2)= moderate chance of dozing; (3)= high chance of dozing  Chance  Situtation    Sitting and reading: 1    Watching TV: 1    Sitting Inactive in public: 0    As a passenger in car: 0      Lying down to rest: 3    Sitting and talking: 0    Sitting quielty after lunch: 0    In a car, stopped in traffic: 0   TOTAL SCORE:   5 out of 24    SLEEP STUDIES:  SPLIT (03/22/17) AHI 41.3, min SPO2 82%, recommended APAP at 12-18 cmh20   CPAP COMPLIANCE DATA:  Date Range: 01/09/2023-04/08/2023  Average Daily Use: 8 hours 20 minutes  Median Use: 8 hours 21 minutes  Compliance for > 4 Hours: 99 %  AHI: 0.6 respiratory events per hour  Days Used: 90/90 days  Mask Leak: 35.3  95th Percentile Pressure: 13.6  LABS: No results found for this or any previous visit (from the past 2160 hour(s)).  Radiology: No results found.  No results found.  No results found.    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 OSA on CPAP PLAN OSA:   Patient evaluation reveals severe sleep apnea with AHI of 41. She is well controlled on APAP 12-18 with AHI of 0.6, and very  compliant with compliance rate of 99%. She does not wish to replace the machine at this time but is advised to do so if the machine starts sending end of life message.  Patient has comorbid cardiovascular risk factors including: hypertension  which could be exacerbated by pathologic sleep-disordered breathing.  Suggest: Continue APAP 12-18  to  treat the patient's sleep disordered breathing. The patient was also counselled on weight loss  to optimize sleep health.  2. CPAP use counseling CPAP Counseling: had a lengthy discussion with the patient regarding the importance of PAP therapy in management of the sleep apnea. Patient appears to understand the risk factor reduction and also understands the risks associated with untreated sleep apnea. Patient will try to make a good faith effort to remain compliant with therapy. Also instructed the patient on proper cleaning of the device including the water must be changed daily if possible and use of distilled water is preferred. Patient understands that the machine should be regularly cleaned with appropriate recommended cleaning solutions that do not damage the PAP machine for example given white vinegar and water rinses. Other methods such as ozone treatment may not be as good as these simple methods to achieve cleaning.   3. Hypertension, unspecified type Hypertension Counseling:   The following hypertensive lifestyle modification were recommended and discussed:  1. Limiting alcohol intake to less than 1 oz/day of ethanol:(24 oz of beer or 8 oz of wine or 2 oz of 100-proof whiskey). 2. Take baby ASA 81 mg daily. 3. Importance of regular aerobic exercise and losing weight. 4. Reduce dietary saturated fat and cholesterol intake for overall cardiovascular health. 5. Maintaining adequate dietary potassium, calcium, and magnesium intake. 6. Regular monitoring of the blood pressure. 7. Reduce sodium intake to less than 100 mmol/day (less than 2.3 gm of sodium or less than 6 gm of sodium choride)      General Counseling: I have discussed the findings of the evaluation and examination with Elizabeth Yu.  I have also discussed any further diagnostic evaluation thatmay be needed or ordered today. Elizabeth Yu  verbalizes understanding of the findings of todays visit. We also reviewed her medications today and discussed drug interactions and side effects including but not limited excessive drowsiness and altered mental states. We also discussed that there is always a risk not just to her but also people around her. she has been encouraged to call the office with any questions or concerns that should arise related to todays visit.  No orders of the defined types were placed in this encounter.       I have personally obtained a history, evaluated the patient, evaluated pertinent data, formulated the assessment and plan and placed orders.   This patient was seen today by Emmaline Kluver, PA-C in collaboration with Dr. Freda Munro.   Yevonne Pax, MD [facility] Diplomate ABMS Pulmonary and Critical Care Medicine Sleep medicine

## 2023-04-09 ENCOUNTER — Ambulatory Visit (INDEPENDENT_AMBULATORY_CARE_PROVIDER_SITE_OTHER): Payer: Medicare Other | Admitting: Internal Medicine

## 2023-04-09 VITALS — BP 150/89 | HR 62 | Resp 12 | Ht 61.0 in | Wt 208.5 lb

## 2023-04-09 DIAGNOSIS — G4733 Obstructive sleep apnea (adult) (pediatric): Secondary | ICD-10-CM | POA: Insufficient documentation

## 2023-04-09 DIAGNOSIS — Z7189 Other specified counseling: Secondary | ICD-10-CM | POA: Diagnosis not present

## 2023-04-09 DIAGNOSIS — I10 Essential (primary) hypertension: Secondary | ICD-10-CM | POA: Insufficient documentation

## 2023-04-09 NOTE — Patient Instructions (Signed)

## 2024-03-12 ENCOUNTER — Ambulatory Visit (INDEPENDENT_AMBULATORY_CARE_PROVIDER_SITE_OTHER)

## 2024-03-12 ENCOUNTER — Ambulatory Visit: Admitting: Podiatry

## 2024-03-12 DIAGNOSIS — M722 Plantar fascial fibromatosis: Secondary | ICD-10-CM

## 2024-03-12 MED ORDER — MELOXICAM 15 MG PO TABS: 15.0000 mg | ORAL_TABLET | Freq: Every day | ORAL | 3 refills | Status: AC

## 2024-03-12 MED ORDER — METHYLPREDNISOLONE 4 MG PO TBPK: ORAL_TABLET | ORAL | 0 refills | Status: DC

## 2024-03-12 NOTE — Progress Notes (Signed)
 Subjective:  Patient ID: Elizabeth Yu, female    DOB: [birth year],  MRN: [id number] HPI Chief Complaint  Patient presents with   Foot Pain    Left heel pain    68 y.o. female presents with the above complaint.   ROS: Denies fever chills nausea body muscle aches pains calf pain back pain chest pain shortness of breath.  Past Medical History:  Diagnosis Date   Anemia    Anxiety    Arthritis    Depression    Edema    ANKLES OCCAS   GERD (gastroesophageal reflux disease)    Hypertension    Sleep apnea    CPAP   Past Surgical History:  Procedure Laterality Date   ABDOMINAL HYSTERECTOMY     CATARACT EXTRACTION W/PHACO Left 10/18/2017   Procedure: CATARACT EXTRACTION PHACO AND INTRAOCULAR LENS PLACEMENT (IOC);  Surgeon: Myrna Adine Anes, MD;  Location: [facility] ORS;  Service: Ophthalmology;  Laterality: Left;  Lot #[id number] H [place] : 00:19.1 AP%: 7.3 CDE: 1.38   CATARACT EXTRACTION W/PHACO Right 11/15/2017   Procedure: CATARACT EXTRACTION PHACO AND INTRAOCULAR LENS PLACEMENT (IOC);  Surgeon: Myrna Adine Anes, MD;  Location: [facility] ORS;  Service: Ophthalmology;  Laterality: Right;  Lot # [id number] H [place] : 00:09.9 AP%: 7.0 CDE: 0.69   CYSTOSCOPY  08/15/2018   Procedure: CYSTOSCOPY;  Surgeon: Verdon Keen, MD;  Location: [facility] ORS;  Service: Gynecology;;   ESOPHAGOGASTRODUODENOSCOPY N/A 02/12/2015   Procedure: ESOPHAGOGASTRODUODENOSCOPY (EGD);  Surgeon: Lamar ONEIDA Holmes, MD;  Location: [facility] ENDOSCOPY;  Service: Endoscopy;  Laterality: N/A;   FRACTURE SURGERY Right    PLATE AND 13 SCREWS   HERNIA REPAIR     ORIF WRIST FRACTURE Right 12/12/2012   Procedure: OPEN REDUCTION INTERNAL FIXATION (ORIF) WRIST FRACTURE Right ;  Surgeon: Balinda JAYSON Rogue, MD;  Location: [facility] OR;  Service: Plastics;  Laterality: Right;   ROTATOR CUFF REPAIR     TONSILLECTOMY     TUBAL LIGATION      Current Outpatient Medications:    meloxicam  (MOBIC ) 15 MG tablet, Take 1 tablet (15 mg total) by mouth daily., Disp:  30 tablet, Rfl: 3   methylPREDNISolone  (MEDROL  DOSEPAK) 4 MG TBPK tablet, 6 day dose pack - take as directed, Disp: 21 tablet, Rfl: 0   citalopram (CELEXA) 20 MG tablet, Take 20 mg by mouth daily., Disp: , Rfl:    levocetirizine (XYZAL) 5 MG tablet, SMARTSIG:1 Tablet(s) By Mouth Every Evening, Disp: , Rfl:    omeprazole (PRILOSEC) 20 MG capsule, Take 20 mg by mouth daily., Disp: , Rfl:    triamterene -hydrochlorothiazide  (DYAZIDE ) 37.5-25 MG capsule, Take 1 capsule by mouth daily., Disp: , Rfl: 0  No Known Allergies Review of Systems Objective:  There were no vitals filed for this visit.  General: Well developed, nourished, in no acute distress, alert and oriented x3   Dermatological: Skin is warm, dry and supple bilateral. Nails x 10 are well maintained; remaining integument appears unremarkable at this time. There are no open sores, no preulcerative lesions, no rash or signs of infection present.  Vascular: Dorsalis Pedis artery and Posterior Tibial artery pedal pulses are 2/4 bilateral with immedate capillary fill time. Pedal hair growth present. No varicosities and no lower extremity edema present bilateral.   Neruologic: Grossly intact via light touch bilateral. Vibratory intact via tuning fork bilateral. Protective threshold with Semmes Wienstein monofilament intact to all pedal sites bilateral. Patellar and Achilles deep tendon reflexes 2+ bilateral. No Babinski or clonus noted bilateral.   Musculoskeletal: No gross boney  pedal deformities bilateral. No pain, crepitus, or limitation noted with foot and ankle range of motion bilateral. Muscular strength 5/5 in all groups tested bilateral.  Pain on palpation medial calcaneal tubercle of the left heel.  Gait: Unassisted, Nonantalgic.    Radiographs:  Radiographs taken today demonstrate osseously mature individual soft tissue increase in density plantar fascial cannula surgical site mild plantar distally oriented calcaneal heel  spur.  Assessment & Plan:   Assessment: Plantar fasciitis.  Plan: Discussed appropriate shoe gear stretching exercise ice therapy shoewear modifications.  Injected the heel 20 mg Kenalog  milligrams Marcaine  for maximal tenderness.  Also started methylprednisolone  to be followed by meloxicam .  Follow-up with her in 1 month  She likes to be called DD     Elizabeth Yu T. [place], [place]

## 2024-03-13 DIAGNOSIS — M722 Plantar fascial fibromatosis: Secondary | ICD-10-CM | POA: Diagnosis not present

## 2024-03-13 MED ORDER — TRIAMCINOLONE ACETONIDE 40 MG/ML IJ SUSP
20.0000 mg | Freq: Once | INTRAMUSCULAR | Status: AC
  Administered 2024-03-13: 20 mg

## 2024-03-13 NOTE — Addendum Note (Signed)
 Addended by: ELAYNE ROSINA BRAVO on: 03/13/2024 07:39 PM   Modules accepted: Orders, Level of Service

## 2024-04-16 ENCOUNTER — Ambulatory Visit: Admitting: Podiatry

## 2024-04-16 DIAGNOSIS — M722 Plantar fascial fibromatosis: Secondary | ICD-10-CM

## 2024-04-16 MED ORDER — MELOXICAM 15 MG PO TABS: 15.0000 mg | ORAL_TABLET | Freq: Every day | ORAL | 3 refills | Status: AC

## 2024-04-16 MED ORDER — TRIAMCINOLONE ACETONIDE 40 MG/ML IJ SUSP
20.0000 mg | Freq: Once | INTRAMUSCULAR | Status: AC
  Administered 2024-04-16: 20 mg

## 2024-04-16 NOTE — Progress Notes (Signed)
 She presents today for follow-up of her plantar fasciitis of her left foot.  States that he really did not improve that much with the injection but the medication made it feel better and she was waiting to hear from us  as to what we are going to do next before she refilled her meloxicam .  She denies any problems taking the meloxicam .  Objective: Vital signs stable she is alert and oriented x 3 she still has pain on palpation medial calcaneal tubercle of the left heel she still has some tenderness on palpation lateral aspect of the foot as well with some lateral compensatory syndrome.  Assessment: Plantar fasciitis lateral compensatory syndrome left foot.  Plan: Reinjected left heel today 20 mg Kenalog  5 mg Marcaine  point maximal tenderness.  Tolerated the procedure well refilled her meloxicam .  Follow-up with her in 6 weeks.

## 2024-06-02 ENCOUNTER — Ambulatory Visit: Admitting: Podiatrist

## 2024-08-12 ENCOUNTER — Ambulatory Visit: Admitting: Podiatry

## 2024-08-19 NOTE — Progress Notes (Signed)
 [facility] [address] [place], [place] [address]  Pulmonary Sleep Medicine   Office Visit Note  Patient Name: Elizabeth Yu DOB: 05-12-1956 MRN [id number]    Chief Complaint: Obstructive Sleep Apnea visit  Brief History:  Takasha is seen today for a follow up visit for APAP@ 12-18 cmH2O. The patient has a 7 year history of sleep apnea. Patient is using PAP nightly.  The patient feels rested after sleeping with PAP.  The patient reports benefiting from PAP use. Reported sleepiness is  improved and the Epworth Sleepiness Score is 4 out of 24. The patient very rarely take naps. The patient complains of the following: patient is in need of a replacement unit as her current unit is 69 years old and has reached maximum motor lifetime expectancy.  The compliance download shows 96% compliance with an average use time of 8 hours 10 minutes. The AHI is 1 . The patient does not complain of limb movements disrupting sleep. The patient continues to require PAP therapy in order to eliminate sleep apnea.   ROS  General: (-) fever, (-) chills, (-) night sweat Nose and Sinuses: (-) nasal stuffiness or itchiness, (-) postnasal drip, (-) nosebleeds, (-) sinus trouble. Mouth and Throat: (-) sore throat, (-) hoarseness. Neck: (-) swollen glands, (-) enlarged thyroid, (-) neck pain. Respiratory: - cough, +shortness of breath (with exertion only) , - wheezing. Neurologic: - numbness, - tingling. Psychiatric: + anxiety, + depression   Current Medication: Outpatient Encounter Medications as of 08/25/2024  Medication Sig   amLODipine (NORVASC) 10 MG tablet Take 10 mg by mouth daily.   citalopram (CELEXA) 20 MG tablet Take 20 mg by mouth daily.   levocetirizine (XYZAL) 5 MG tablet SMARTSIG:1 Tablet(s) By Mouth Every Evening   meloxicam  (MOBIC ) 15 MG tablet Take 1 tablet (15 mg total) by mouth daily.   meloxicam  (MOBIC ) 15 MG tablet Take 1 tablet (15 mg total) by mouth daily.   omeprazole  (PRILOSEC) 20 MG capsule Take 20 mg by mouth daily.   [DISCONTINUED] methylPREDNISolone  (MEDROL  DOSEPAK) 4 MG TBPK tablet 6 day dose pack - take as directed   [DISCONTINUED] triamterene -hydrochlorothiazide  (DYAZIDE ) 37.5-25 MG capsule Take 1 capsule by mouth daily.   No facility-administered encounter medications on file as of 08/25/2024.    Surgical History: Past Surgical History:  Procedure Laterality Date   ABDOMINAL HYSTERECTOMY     CATARACT EXTRACTION W/PHACO Left 10/18/2017   Procedure: CATARACT EXTRACTION PHACO AND INTRAOCULAR LENS PLACEMENT (IOC);  Surgeon: Myrna Adine Anes, MD;  Location: [facility] ORS;  Service: Ophthalmology;  Laterality: Left;  Lot #[id number] H US : 00:19.1 AP%: 7.3 CDE: 1.38   CATARACT EXTRACTION W/PHACO Right 11/15/2017   Procedure: CATARACT EXTRACTION PHACO AND INTRAOCULAR LENS PLACEMENT (IOC);  Surgeon: Myrna Adine Anes, MD;  Location: [facility] ORS;  Service: Ophthalmology;  Laterality: Right;  Lot # [id number] H US : 00:09.9 AP%: 7.0 CDE: 0.69   CYSTOSCOPY  08/15/2018   Procedure: CYSTOSCOPY;  Surgeon: Verdon Keen, MD;  Location: [facility] ORS;  Service: Gynecology;;   ESOPHAGOGASTRODUODENOSCOPY N/A 02/12/2015   Procedure: ESOPHAGOGASTRODUODENOSCOPY (EGD);  Surgeon: Lamar ONEIDA Holmes, MD;  Location: [facility] ENDOSCOPY;  Service: Endoscopy;  Laterality: N/A;   FRACTURE SURGERY Right    PLATE AND 13 SCREWS   HERNIA REPAIR     ORIF WRIST FRACTURE Right 12/12/2012   Procedure: OPEN REDUCTION INTERNAL FIXATION (ORIF) WRIST FRACTURE Right ;  Surgeon: Balinda JAYSON Rogue, MD;  Location: [facility] OR;  Service: Plastics;  Laterality: Right;   ROTATOR CUFF  REPAIR     TONSILLECTOMY     TUBAL LIGATION      Medical History: Past Medical History:  Diagnosis Date   Anemia    Anxiety    Arthritis    Depression    Edema    ANKLES OCCAS   GERD (gastroesophageal reflux disease)    Hypertension    Sleep apnea    CPAP    Family History: Non contributory to the present illness  Social  History: Social History   Socioeconomic History   Marital status: Divorced    Spouse name: Not on file   Number of children: Not on file   Years of education: Not on file   Highest education level: Not on file  Occupational History   Not on file  Tobacco Use   Smoking status: Former    Current packs/day: 0.00    Average packs/day: 1 pack/day for 30.0 years (30.0 ttl pk-yrs)    Types: Cigarettes    Start date: 07/1972    Quit date: 07/2002    Years since quitting: 22.0   Smokeless tobacco: Never  Vaping Use   Vaping status: Never Used  Substance and Sexual Activity   Alcohol use: Yes    Comment: occasional   Drug use: No   Sexual activity: Yes  Other Topics Concern   Not on file  Social History Narrative   Not on file   Social Drivers of Health   Tobacco Use: Medium Risk (08/17/2023)   Received from [facility]   Patient History    Smoking Tobacco Use: Former    Smokeless Tobacco Use: Never    Passive Exposure: Not on Actuary Strain: Not on file  Food Insecurity: Not on file  Transportation Needs: Not on file  Physical Activity: Not on file  Stress: Not on file  Social Connections: Not on file  Intimate Partner Violence: Not on file  Depression (EYV7-0): Not on file  Alcohol Screen: Not on file  Housing: Not on file  Utilities: Not on file  Health Literacy: Not on file    Vital Signs: Blood pressure (!) 142/92, pulse 68, resp. rate 16, height 5' 0.5 (1.537 m), weight 221 lb (100.2 kg), SpO2 96%. Body mass index is 42.45 kg/m.    Examination: General Appearance: The patient is well-developed, well-nourished, and in no distress. Neck Circumference: 43.5 cm Skin: Gross inspection of skin unremarkable. Head: normocephalic, no gross deformities. Eyes: no gross deformities noted. ENT: ears appear grossly normal Neurologic: Alert and oriented. No involuntary movements.  STOP BANG RISK ASSESSMENT S (snore) Have you been  told that you snore?     YES   T (tired) Are you often tired, fatigued, or sleepy during the day?   YES  O (obstruction) Do you stop breathing, choke, or gasp during sleep? NO   P (pressure) Do you have or are you being treated for high blood pressure? YES   B (BMI) Is your body index greater than 35 kg/m? YES   A (age) Are you 38 years old or older? YES   N (neck) Do you have a neck circumference greater than 16 inches?   YES   G (gender) Are you a female? NO   TOTAL STOP/BANG YES ANSWERS 6       A STOP-Bang score of 2 or less is considered low risk, and a score of 5 or more is high risk for having either moderate or severe OSA. For people  who score 3 or 4, doctors may need to perform further assessment to determine how likely they are to have OSA.         EPWORTH SLEEPINESS SCALE:  Scale:  (0)= no chance of dozing; (1)= slight chance of dozing; (2)= moderate chance of dozing; (3)= high chance of dozing  Chance  Situtation    Sitting and reading: 1    Watching TV: 1    Sitting Inactive in public: 0    As a passenger in car: 0      Lying down to rest: 1    Sitting and talking: 0    Sitting quielty after lunch: 1    In a car, stopped in traffic: 0   TOTAL SCORE:   4 out of 24    SLEEP STUDIES:  Split Study (02/2017) AHI 41.3/hr, min SpO2 82%, recommend APAP@ 12-18 cmH2O   CPAP COMPLIANCE DATA:  Date Range: 08/27/2023-08/25/2024  Average Daily Use: 8 hours 10 minutes  Median Use: 8 hours 10 minutes  Compliance for > 4 Hours: 96%  AHI: 1.0 respiratory events per hour  Days Used: 365/365 days  Mask Leak: 53  95th Percentile Pressure: 13.7         LABS: No results found for this or any previous visit (from the past 2160 hours).  Radiology: No results found.  No results found.  No results found.    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 OSA on CPAP (Primary) The patient does tolerate PAP and reports  benefit from PAP use. Her machine has reached end of life and needs replacement to ensure consistent therapy. The patient was reminded how to clean equipment and advised to replace supplies routinely. The patient was also counselled on weight loss. The compliance is excellent. The AHI is 1.0.   OSA on cpap- controlled. Continue with excellent compliance with pap. CPAP continues to be medically necessary to treat this patient's OSA. F/u one year.    2. CPAP use counseling CPAP Counseling: had a lengthy discussion with the patient regarding the importance of PAP therapy in management of the sleep apnea. Patient appears to understand the risk factor reduction and also understands the risks associated with untreated sleep apnea. Patient will try to make a good faith effort to remain compliant with therapy. Also instructed the patient on proper cleaning of the device including the water must be changed daily if possible and use of distilled water is preferred. Patient understands that the machine should be regularly cleaned with appropriate recommended cleaning solutions that do not damage the PAP machine for example given white vinegar and water rinses. Other methods such as ozone treatment may not be as good as these simple methods to achieve cleaning.   3. Hypertension, unspecified type Hypertension Counseling:   The following hypertensive lifestyle modification were recommended and discussed:  1. Limiting alcohol intake to less than 1 oz/day of ethanol:(24 oz of beer or 8 oz of wine or 2 oz of 100-proof whiskey). 2. Take baby ASA 81 mg daily. 3. Importance of regular aerobic exercise and losing  weight. 4. Reduce dietary saturated fat and cholesterol intake  for overall cardiovascular health. 5. Maintaining adequate dietary potassium, calcium, and magnesium  intake. 6. Regular monitoring of the blood pressure. 7. Reduce sodium intake to less than 100 mmol/day (less than 2.3 gm of sodium or less than 6 gm of sodium choride)       General Counseling: I have discussed the findings of the evaluation and examination with Thara.  I have also discussed any further diagnostic evaluation thatmay be needed or ordered today. Melenda verbalizes understanding of the findings of todays visit. We also reviewed her medications today and discussed drug interactions and side effects including but not limited excessive drowsiness and altered mental states. We also discussed that there is always a risk not just to her but also people around her. she has been encouraged to call the office with any questions or concerns that should arise related to todays visit.  No orders of the defined types were placed in this encounter.       I have personally obtained a history, examined the patient, evaluated laboratory and imaging results, formulated the assessment and plan and placed orders. This patient was seen today by Lauraine Lay, PA-C in collaboration with Dr. Elfreda Bathe.   Elfreda DELENA Bathe, MD [facility] Diplomate ABMS Pulmonary Critical Care Medicine and Sleep Medicine

## 2024-08-25 ENCOUNTER — Ambulatory Visit: Admitting: Internal Medicine

## 2024-08-25 VITALS — BP 142/92 | HR 68 | Resp 16 | Ht 60.5 in | Wt 221.0 lb

## 2024-08-25 DIAGNOSIS — Z7189 Other specified counseling: Secondary | ICD-10-CM | POA: Diagnosis not present

## 2024-08-25 DIAGNOSIS — I10 Essential (primary) hypertension: Secondary | ICD-10-CM

## 2024-08-25 DIAGNOSIS — G4733 Obstructive sleep apnea (adult) (pediatric): Secondary | ICD-10-CM | POA: Diagnosis not present

## 2024-08-25 NOTE — Patient Instructions (Signed)
